# Patient Record
Sex: Male | Born: 1965 | Race: White | Hispanic: No | Marital: Married | Smoking: Former smoker
Health system: Southern US, Community
[De-identification: ages and names within clinical notes are randomized; demographics above are authoritative.]

## PROBLEM LIST (undated history)

## (undated) DIAGNOSIS — K219 Gastro-esophageal reflux disease without esophagitis: Secondary | ICD-10-CM

## (undated) DIAGNOSIS — F419 Anxiety disorder, unspecified: Secondary | ICD-10-CM

## (undated) DIAGNOSIS — J189 Pneumonia, unspecified organism: Secondary | ICD-10-CM

## (undated) DIAGNOSIS — Z8619 Personal history of other infectious and parasitic diseases: Secondary | ICD-10-CM

## (undated) DIAGNOSIS — E785 Hyperlipidemia, unspecified: Secondary | ICD-10-CM

## (undated) DIAGNOSIS — Z87442 Personal history of urinary calculi: Secondary | ICD-10-CM

## (undated) HISTORY — DX: Gastro-esophageal reflux disease without esophagitis: K21.9

## (undated) HISTORY — DX: Personal history of other infectious and parasitic diseases: Z86.19

## (undated) HISTORY — PX: CHOLECYSTECTOMY: SHX55

## (undated) HISTORY — DX: Personal history of urinary calculi: Z87.442

## (undated) HISTORY — DX: Anxiety disorder, unspecified: F41.9

## (undated) HISTORY — DX: Hyperlipidemia, unspecified: E78.5

---

## 1999-05-09 HISTORY — PX: FOOT SURGERY: SHX648

## 2009-11-24 ENCOUNTER — Ambulatory Visit: Payer: Self-pay | Admitting: Family

## 2009-11-24 DIAGNOSIS — F341 Dysthymic disorder: Secondary | ICD-10-CM | POA: Insufficient documentation

## 2009-11-24 DIAGNOSIS — F17200 Nicotine dependence, unspecified, uncomplicated: Secondary | ICD-10-CM | POA: Insufficient documentation

## 2009-12-22 ENCOUNTER — Ambulatory Visit: Payer: Self-pay | Admitting: Family

## 2010-01-18 ENCOUNTER — Ambulatory Visit: Payer: Self-pay | Admitting: Family

## 2010-01-24 ENCOUNTER — Telehealth: Payer: Self-pay | Admitting: Family

## 2010-01-28 ENCOUNTER — Ambulatory Visit: Payer: Self-pay | Admitting: Family

## 2010-02-15 ENCOUNTER — Telehealth (INDEPENDENT_AMBULATORY_CARE_PROVIDER_SITE_OTHER): Payer: Self-pay | Admitting: *Deleted

## 2010-05-10 ENCOUNTER — Ambulatory Visit
Admission: RE | Admit: 2010-05-10 | Discharge: 2010-05-10 | Payer: Self-pay | Source: Home / Self Care | Attending: Family | Admitting: Family

## 2010-05-10 ENCOUNTER — Encounter: Payer: Self-pay | Admitting: Family

## 2010-05-10 LAB — CONVERTED CEMR LAB
Alkaline Phosphatase: 141 units/L — ABNORMAL HIGH (ref 39–117)
Basophils Absolute: 0 10*3/uL (ref 0.0–0.1)
Basophils Relative: 0 % (ref 0–1)
Bilirubin, Direct: 0.1 mg/dL (ref 0.0–0.3)
Indirect Bilirubin: 0.2 mg/dL (ref 0.0–0.9)
Lipase: 19 units/L (ref 0–75)
Lymphocytes Relative: 31 % (ref 12–46)
MCHC: 32.9 g/dL (ref 30.0–36.0)
Neutro Abs: 5.6 10*3/uL (ref 1.7–7.7)
Neutrophils Relative %: 61 % (ref 43–77)
RBC: 5.13 M/uL (ref 4.22–5.81)
RDW: 13.2 % (ref 11.5–15.5)

## 2010-05-11 ENCOUNTER — Telehealth: Payer: Self-pay | Admitting: Family

## 2010-05-11 ENCOUNTER — Ambulatory Visit (HOSPITAL_BASED_OUTPATIENT_CLINIC_OR_DEPARTMENT_OTHER)
Admission: RE | Admit: 2010-05-11 | Discharge: 2010-05-11 | Payer: Self-pay | Source: Home / Self Care | Attending: Internal Medicine | Admitting: Internal Medicine

## 2010-05-23 ENCOUNTER — Encounter: Payer: Self-pay | Admitting: Family

## 2010-05-25 ENCOUNTER — Ambulatory Visit (HOSPITAL_COMMUNITY)
Admission: RE | Admit: 2010-05-25 | Discharge: 2010-05-25 | Payer: Self-pay | Source: Home / Self Care | Attending: Surgery | Admitting: Surgery

## 2010-05-27 NOTE — Op Note (Signed)
Jonathan Powell, Jonathan Powell                 ACCOUNT NO.:  [id number]  MEDICAL RECORD NO.:  [id number]          PATIENT TYPE:  AMB  LOCATION:  SDS                          FACILITY:  [facility]  PHYSICIAN:  Abigail Miyamoto, M.D. DATE OF BIRTH:  11/13/1965  DATE OF PROCEDURE:  05/25/2010 DATE OF DISCHARGE:  05/25/2010                              OPERATIVE REPORT   PREOPERATIVE DIAGNOSIS:  Symptomatic cholelithiasis.  POSTOPERATIVE DIAGNOSIS:  Symptomatic cholelithiasis.  PROCEDURE:  Laparoscopic cholecystectomy.  SURGEON:  Abigail Miyamoto, MD  ANESTHESIA:  General and Marcaine.  ESTIMATED BLOOD LOSS:  Minimal.  FINDINGS:  The patient had chronically scarred appearing gallbladder with gallstones consistent with chronic cholecystitis.  PROCEDURE IN DETAIL:  The patient was brought to the operating room and identified as Jonathan Powell.  He was placed supine on the operating room table.  General anesthesia was induced.  His abdomen was then prepped and draped in the usual sterile fashion.  Using a #15 blade, a small vertical incision was made below the umbilicus.  This was carried down to the fascia, which was then opened with a scalpel.  A hemostat was used to pass through the peritoneal cavity.  Next, 0 Vicryl suture pursestring suture was placed around the fascial opening.  The Hasson port was placed through the opening and insufflation of the abdomen was begun.  A 5-mm port was then placed  A 5-mm port was then placed in the patient's epigastrium and two more 5-mm ports were placed in the patient's right upper quadrant, all under direct vision.  The gallbladder was grasped, retracted above the liver bed, was found to be thick walled in appearance.  The cystic duct was easily dissected out. I then clipped it once distally and opened up with laparoscopic scissors.  It was a very tiny duct.  I placed a cholangiocatheter through a small incision in the right upper quadrant after I  had multiple times to get into the tiny opening of cystic duct.  This was not possible.  Therefore, I aborted cholangiogram.  I clipped the cystic duct 3 times proximally and completely transected it.  The cystic artery and posterior branches were identified, clipped proximally and distally, and transected as well.  The gallbladder was slowly dissected free from liver bed with electrocautery.  Once it was free from liver bed, it was removed through the incision at the umbilicus.  I again examined the liver bed and hemostasis was felt to be achieved with cautery.  The 0 Vicryl at the umbilicus was tied in place closing the fascial defect. All ports were then removed under direct vision after irrigating the abdomen.  All incisions were then anesthetized with Marcaine and closed with 4-0 Monocryl subcuticular sutures.  Steri-Strips and Band-Aids were applied.  The patient tolerated the procedure well.  All sponge, needle, and instrument counts were correct at the end of the procedure.  The patient was then extubated in the operating room and taken in stable condition to the recovery room.     Abigail Miyamoto, M.D.     DB/MEDQ  D:  05/25/2010  T:  05/26/2010  Job:  [id number]  Electronically Signed by Abigail Miyamoto M.D. on 05/27/2010 11:03:45 AM

## 2010-05-30 LAB — COMPREHENSIVE METABOLIC PANEL
ALT: 56 U/L — ABNORMAL HIGH (ref 0–53)
Alkaline Phosphatase: 102 U/L (ref 39–117)
BUN: 13 mg/dL (ref 6–23)
CO2: 29 mEq/L (ref 19–32)
Calcium: 9.6 mg/dL (ref 8.4–10.5)
GFR calc non Af Amer: >60 mL/min (ref >60)
Glucose, Bld: 103 mg/dL — ABNORMAL HIGH (ref 70–99)
Sodium: 142 mEq/L (ref 135–145)
Total Protein: 6.1 g/dL (ref 6.0–8.3)

## 2010-05-30 LAB — SURGICAL PCR SCREEN
MRSA, PCR: NEGATIVE
Staphylococcus aureus: NEGATIVE

## 2010-05-30 LAB — CBC
HCT: 43.9 % (ref 39.0–52.0)
Hemoglobin: 15 g/dL (ref 13.0–17.0)
MCH: 31.4 pg (ref 26.0–34.0)
MCHC: 34.2 g/dL (ref 30.0–36.0)
MCV: 91.8 fL (ref 78.0–100.0)
RDW: 12.5 % (ref 11.5–15.5)

## 2010-06-07 NOTE — Assessment & Plan Note (Signed)
Summary: 1 month follow up/mhf   Vital Signs:  Patient profile:   45 year old male Height:      70.5 inches (179.07 cm) Weight:      155.25 pounds (70.57 kg) BMI:     22.04 Temp:     97.9 degrees F (36.61 degrees C) oral Pulse rate:   72 / minute Pulse rhythm:   regular BP sitting:   102 / 74  (right arm) Cuff size:   regular  Vitals Entered By: Brenton Grills MA (December 22, 2009 8:35 AM) CC: 1 month F/U/aj   CC:  1 month F/U/aj.  History of Present Illness: Mr. Jonathan Powell is a 45 year old male who presents today for follow up of his anxiety/depression.  He and his wife separated 9 weeks ago.  He remains very upset about his separation.  Still not sleeping well.  Notes some mild improvement in his depression since he started the Zoloft.  Denies any noticeable side effects.  Continues to have panic attacks, but not every day.  Klonopin does help him during his panic attacks.  He continues to meet weekly with his therapist.  Pt reports that he is eating well, 3 meals a day.  Snacks regularly.    Current Medications (verified): 1)  Daily Multi  Tabs (Multiple Vitamins-Minerals) .... Take 1 Tablet By Mouth Once A Day 2)  Omega-3 350 Mg Caps (Omega-3 Fatty Acids) .... Take 1 Capsule By Mouth Once A Day 3)  Zoloft 50 Mg Tabs (Sertraline Hcl) .... 1/2 Tab By Mouth Daily X 1 Week, Then Increase To One Tablet By Mouth Daily On The Second Week. 4)  Klonopin 0.5 Mg Tabs (Clonazepam) .... One Tab By Mouth Three Time Daily As Needed  Allergies (verified): No Known Drug Allergies  Physical Exam  General:  Well-developed,well-nourished,in no acute distress; alert,appropriate and cooperative throughout examination Psych:  normally interactive, good eye contact, and subdued.     Impression & Recommendations:  Problem # 1:  ANXIETY DEPRESSION (ICD-300.4) Assessment Unchanged Very little improvement in his symptoms, will increase zoloft from 50mg  to 100mg . Continue PRN klonopin and weekly  counseling sessions.  If no improvement with this regimen will consider switching to cymbalta.  20 minutes were spent with patient.  Greater than 50% of this time was spent counseling patient on his depression/anxiety.    Problem # 2:  WEIGHT LOSS, RECENT (ICD-783.21) Assessment: New Pt continues to lose weight.  Reports that he is eating well- "but only when I am hungry"  recommended that he eat scheduled meals and add a protein shake twice daily in addition to his meals to help prevent further weight loss.    Complete Medication List: 1)  Daily Multi Tabs (Multiple vitamins-minerals) .... Take 1 tablet by mouth once a day 2)  Omega-3 350 Mg Caps (Omega-3 fatty acids) .... Take 1 capsule by mouth once a day 3)  Zoloft 100 Mg Tabs (Sertraline hcl) .... One tablet by mouth daily 4)  Klonopin 0.5 Mg Tabs (Clonazepam) .... One tab by mouth three time daily as needed  Patient Instructions: 1)  Please follow up with your therapist as you are. 2)  Increase your Zoloft to 100mg  by mouth daily. 3)  Follow up in 1 month, sooner if symptoms worsen or do not improve.  Prescriptions: ZOLOFT 100 MG TABS (SERTRALINE HCL) one tablet by mouth daily  #30 x 1   Entered and Authorized by:   Lemont Fillers FNP   Signed by:  Lemont Fillers FNP on 12/22/2009   Method used:   Electronically to        [facility] [phone]* (retail)       [address] [place], [place]  [address]       Ph: [phone] or [phone]       Fax: [phone]   RxID:   [id number]

## 2010-06-07 NOTE — Progress Notes (Signed)
Summary: Records request  Records request received from [facility]. Forwarded to [employer] for processing. Rene Kocher Flowers  February 15, 2010 11:41 AM

## 2010-06-07 NOTE — Assessment & Plan Note (Signed)
Summary: TO EST   /ANXIETY/HEA--Rm 4   Vital Signs:  Patient profile:   45 year old male Height:      70.5 inches Weight:      159.50 pounds BMI:     22.64 Temp:     98.3 degrees F oral Pulse rate:   84 / minute Pulse rhythm:   regular Resp:     20 per minute BP sitting:   110 / 78  (right arm) Cuff size:   regular  Vitals Entered By: Mervin Kung CMA Duncan Dull) (November 24, 2009 8:37 AM) CC: Room 4  New pt.  Not sleeping well, having anxiety and chest pains. Is Patient Diabetic? No   CC:  Room 4  New pt.  Not sleeping well and having anxiety and chest pains..  History of Present Illness: [name] is a 45 year old male who presents today to establish care.  His chief complaint today is anxiety.  Pt reports that he and his wife separated 6 weeks ago and he has found this to be devastating.  Patient has lost 40 ounds in the last 4 months.  Fist 15 lbs were intensional on Atkins.  He reports that the additional weight loss has been since his separation as he has not been eating well.  He started seeing a therapist 7 weeks ago.   Has 7 and 43 year old boys.  Notes + anxiety- notes difficulty falling asleep and difficulty staying asleep.  Sleeps 4-5 hours a night. Notes + panic attacks- severe anxiety attack 3 weeks ago. He does note a sense of generalized chest tightness which acccompanies these panic attacks.  He exercises regularly and denies any chest tightness during exercise.   Reports decreased interest in things he used to like to do.  Denies active suicide ideation.  Preventive Screening-Counseling & Management  Alcohol-Tobacco     Alcohol drinks/day: <1     Smoking Status: current     Packs/Day: 2.0     Pack years: 67yrs  Caffeine-Diet-Exercise     Caffeine use/day: 3-4 daily     Does Patient Exercise: yes     Exercise (avg: min/session): 30-60     Times/week: 3  Allergies (verified): No Known Drug Allergies  Past History:  Past Medical History: Chicken Pox HIV  testing Kidney Stones  Past Surgical History: Right Foot surgery--2001  Family History: Mother--Hypercholesterolemia, living Father-- A&W Maternal GM-- arthritis Maternal GF--A & W Paternal GM--Deceased, dementia Paternal GF-- Deceased, kidney failure, alcoholism two sons- alive and well, one child ADD   Social History: Occupation: vice Engineer, structural, security company Current Smoker-  2 PPD. Alcohol use-yes,  very rare 1-2 drinks Regular exercise-yes Smoking Status:  current Packs/Day:  2.0 Caffeine use/day:  3-4 daily Does Patient Exercise:  yes  Review of Systems       Constitutional: Denies Fever ENT:  Denies nasal congestion or sore throat. Resp: Denies cough CV:  Notes chest tightness associated with anxiety, no chest pain with exercise GI:  Denies nausea or vomitting GU: Denies dysuria Lymphatic: Denies lymphadenopathy Musculoskeletal:  Denies muscle/joint pain Skin:  Denies Rashes Psychiatric: see HPI Neuro: Denies numbness     Physical Exam  General:  Well-developed,well-nourished,in no acute distress; alert,appropriate and cooperative throughout examination Head:  Normocephalic and atraumatic without obvious abnormalities. No apparent alopecia or balding. Lungs:  Normal respiratory effort, chest expands symmetrically. Lungs are clear to auscultation, no crackles or wheezes. Heart:  Normal rate and regular rhythm. S1 and S2 normal  without gallop, murmur, click, rub or other extra sounds. Psych:  became tearful during discussion about his separation. Oriented X3, normally interactive, and good eye contact.     Impression & Recommendations:  Problem # 1:  ANXIETY DEPRESSION (ICD-300.4) Has been seeing a therapist- I encouraged him to continue.  Will give trial of Zoloft and also add as needed klonopin for severe anxiety.  Suspect that he will not need medications long term as his symptoms are largely situational. Encouraged healthy eating.  Pt was  instructed as noted on pt. sign out sheet.  A normal EKG was performed today in the office and notes NSR rate 65. Patient's chest tightness is only associated with panic attacks.   Will monitor.  Plan f/u in 1 month.  30 minutes were spent with patient.  Greater than 50% of this time was spent counseling patient on his anxiety and depression.  Problem # 2:  TOBACCO ABUSE (ICD-305.1) Patient was counseled on importance of smoking cessation. Orders: Tobacco use cessation intermediate 3-10 minutes (99406)  Complete Medication List: 1)  Daily Multi Tabs (Multiple vitamins-minerals) .... Take 1 tablet by mouth once a day 2)  Omega-3 350 Mg Caps (Omega-3 fatty acids) .... Take 1 capsule by mouth once a day 3)  Zoloft 50 Mg Tabs (Sertraline hcl) .... 1/2 tab by mouth daily x 1 week, then increase to one tablet by mouth daily on the second week. 4)  Klonopin 0.5 Mg Tabs (Clonazepam) .... One tab by mouth three time daily as needed  Patient Instructions: 1)  Sertraline (Zoloft) Please start 1/2 tablet one a day for one week, then increase to a full tablet. 2)  It will likely take several weeks before you will notice improvement. 3)  Side effects of this medicine may include drowsiness or nausea.  If this becomes an issue for you call for further instructions. 4)  Very rarely people may develop suicidal thoughts when taking these types of medicines- should this happen to you, discontinue medication and go directly to the emergency room. 5)  Please arrange a follow up appointment in 1 month  Prescriptions: KLONOPIN 0.5 MG TABS (CLONAZEPAM) one tab by mouth three time daily as needed  #30 x 0   Entered and Authorized by:   Lemont Fillers FNP   Signed by:   Lemont Fillers FNP on 11/24/2009   Method used:   Print then Give to Patient   RxID:   [id number] ZOLOFT 50 MG TABS (SERTRALINE HCL) 1/2 tab by mouth daily x 1 week, then increase to one tablet by mouth daily on the second week.  #30  x 2   Entered and Authorized by:   Lemont Fillers FNP   Signed by:   Lemont Fillers FNP on 11/24/2009   Method used:   Electronically to        [facility] [phone][facility] (retail)       [address]       [place], [place]  [address]       Ph: [phone] or [phone]       Fax: [phone]   RxID:   [id number]     Current Allergies (reviewed today): No known allergies

## 2010-06-07 NOTE — Assessment & Plan Note (Signed)
Summary: follow up/hea   Vital Signs:  Patient profile:   45 year old male Weight:      163.50 pounds BMI:     23.21 O2 Sat:      97 % on Room air Temp:     98.1 degrees F oral Pulse rate:   64 / minute Pulse rhythm:   regular Resp:     16 per minute BP sitting:   118 / 66  (right arm) Cuff size:   regular  Vitals Entered By: Glendell Docker CMA (January 28, 2010 8:11 AM)  O2 Flow:  Room air CC: follow-up visit Is Patient Diabetic? No Pain Assessment Patient in pain? no      Comments no concerns, medicatioin working well, no problems   CC:  follow-up visit.  History of Present Illness: [name] is a 45 year old male who presents today to follow up on his anxiety and depression.  Last visit he was changed from Zoloft 50mg  to Zoloft 100mg .  He reports that overall his symptoms have improved.  He notes that he continues to have stress related to his recent separation.  Falls asleep ok, but finds that he often will wake up during the night and have trouble falling back to sleep.  He is hoping not to be on Zoloft long term.  Denies any associated side effects.  Overall feels more "mellow."  Preventive Screening-Counseling & Management  Alcohol-Tobacco     Smoking Status: current  Allergies (verified): No Known Drug Allergies  Past History:  Past Medical History: Last updated: 11/24/2009 Chicken Pox HIV testing Kidney Stones  Past Surgical History: Last updated: 11/24/2009 Right Foot surgery--2001  Physical Exam  General:  Well-developed,well-nourished,in no acute distress; alert,appropriate and cooperative throughout examination Head:  Normocephalic and atraumatic without obvious abnormalities. No apparent alopecia or balding. Psych:  Cognition and judgment appear intact. Alert and cooperative with normal attention span and concentration. No apparent delusions, illusions, hallucinations   Impression & Recommendations:  Problem # 1:  ANXIETY DEPRESSION  (ICD-300.4) Assessment Improved Symptoms are improving.  Will plan to continue medications and have patient follow up after the holidays.  We can consider a taper plan off of Zoloft at that time. 15 minutes were spent with patient.  Greater than 50% of this time was spent counseling patient on his anxiety and depression.  Complete Medication List: 1)  Daily Multi Tabs (Multiple vitamins-minerals) .... Take 1 tablet by mouth once a day 2)  Omega-3 350 Mg Caps (Omega-3 fatty acids) .... Take 1 capsule by mouth once a day 3)  Zoloft 100 Mg Tabs (Sertraline hcl) .... One tablet by mouth daily 4)  Klonopin 0.5 Mg Tabs (Clonazepam) .... One tab by mouth three time daily as needed  Patient Instructions: 1)  Please schedule a follow-up appointment in 3 months. 2)  Follow up sooner if problems or concerns. 3)  Have a nice Fall! Prescriptions: KLONOPIN 0.5 MG TABS (CLONAZEPAM) one tab by mouth three time daily as needed  #30 x 0   Entered and Authorized by:   Lemont Fillers FNP   Signed by:   Lemont Fillers FNP on 01/28/2010   Method used:   Print then Give to Patient   RxID:   [id number] ZOLOFT 100 MG TABS (SERTRALINE HCL) one tablet by mouth daily  #30 x 3   Entered and Authorized by:   Lemont Fillers FNP   Signed by:   Lemont Fillers FNP on 01/28/2010  Method used:   Electronically to        [facility] [phone][facility] (retail)       [address]       [place], [place]  [address]       Ph: [phone] or [phone]       Fax: [phone]   RxID:   [id number]   Current Allergies (reviewed today): No known allergies    Immunization History:  Influenza Immunization History:    Influenza:  declined (01/28/2010)   Contraindications/Deferment of Procedures/Staging:    Test/Procedure: FLU VAX    Reason for deferment: patient declined

## 2010-06-07 NOTE — Progress Notes (Signed)
Summary: no show letter mailed   Phone Note Outgoing Call   Call placed by: Marj Call placed to: Patient Summary of Call: no show letter mailed  Initial call taken by: Roselle Locus,  January 24, 2010 9:14 AM

## 2010-06-09 NOTE — Progress Notes (Signed)
  Phone Note Outgoing Call   Call placed by: Lemont Fillers FNP,  May 11, 2010 4:58 PM Call placed to: Patient Details for Reason: ultrasound results Summary of Call: Called patient, reviewed ultrasound results.  +cholelithiasis.  Wil refer to surgeon for evaluation for possible chronic cholecystitis. Initial call taken by: Lemont Fillers FNP,  May 11, 2010 4:59 PM  New Problems: CHOLELITHIASIS, SYMPTOMATIC (ICD-574.20)   New Problems: CHOLELITHIASIS, SYMPTOMATIC (ICD-574.20)

## 2010-06-09 NOTE — Assessment & Plan Note (Signed)
Summary: COLD/MHF--Rm 5   Vital Signs:  Patient profile:   45 year old male Height:      70.5 inches Weight:      170 pounds BMI:     24.13 Temp:     98.1 degrees F oral Pulse rate:   84 / minute Pulse rhythm:   regular Resp:     16 per minute BP sitting:   130 / 80  (right arm) Cuff size:   regular  Vitals Entered By: Mervin Kung CMA Duncan Dull) (May 10, 2010 2:27 PM) CC: Pt states he has had stomach pain after eating x 4 months. Has frequent indigestion. Also has head and chest congestion with productive cough. Is Patient Diabetic? No Pain Assessment Patient in pain? no        Primary Care Provider:  Lemont Fillers FNP  CC:  Pt states he has had stomach pain after eating x 4 months. Has frequent indigestion. Also has head and chest congestion with productive cough..  History of Present Illness: Stomach pain-  notes that his stomach hurts when he eats- hurts "for a while" after eating.  Has been present for about 2 months.  More on the right side.   + indigestion/heartburn.  Mild improvement with maalox, tums.  Has tried generic prilosec prior to meals with minimal improvement.  Denies vomitting.  BM's perhaps slighly less frequent.  Denies black or bloody stools.  Occasionally nausea.  Dull constant pain. Pain will last several hour after a meal.  Worst at the end of the day.  Has made dietary changes with little improvement.  URI-  started Thursday AM with congestion.  Felt feverish for a few days.  Tried dayquil/nyquil without improvement.  Robitussin did not help.  Mucinex helped.  Clear nasal discharge.    Allergies (verified): No Known Drug Allergies  Past History:  Past Medical History: Last updated: 11/24/2009 Chicken Pox HIV testing Kidney Stones  Past Surgical History: Last updated: 11/24/2009 Right Foot surgery--2001  Review of Systems       see HPI  Physical Exam  General:  Well-developed,well-nourished,in no acute distress;  alert,appropriate and cooperative throughout examination Head:  Normocephalic and atraumatic without obvious abnormalities. No apparent alopecia or balding. Ears:  R TM + erythema, mild bulging Mouth:  Oral mucosa and oropharynx without lesions or exudates.  Teeth in good repair. Neck:  No deformities, masses, or tenderness noted. Lungs:  Normal respiratory effort, chest expands symmetrically. Lungs are clear to auscultation, no crackles or wheezes. Heart:  Normal rate and regular rhythm. S1 and S2 normal without gallop, murmur, click, rub or other extra sounds. Abdomen:  + epigastric tenderness to palpation.  + BS, abdomen is soft, non-distended. Psych:  Cognition and judgment appear intact. Alert and cooperative with normal attention span and concentration. No apparent delusions, illusions, hallucinations   Impression & Recommendations:  Problem # 1:  EPIGASTRIC PAIN (ICD-789.06) Assessment New Suspect ulcer- pt notes frequent use of NSAIDS.  Recommended discontinuation of NSAIDS.  Check labs as below.  Consider further abdominal imaging if elevated transaminases/lipase/amylase.  F/u in 1 month.  If no improvement will plan referral to GI.  Orders: TLB-Hepatic/Liver Function Pnl (80076-HEPATIC) TLB-CBC Platelet - w/Differential (85025-CBCD) T-Amylase (98119-14782) T-Lipase (95621-30865)  Problem # 2:  OTITIS MEDIA (ICD-382.9) Assessment: New Right ear.  Will treat with amoxicillin.  His updated medication list for this problem includes:    Amoxicillin 500 Mg Caps (Amoxicillin) .Marland Kitchen... 2 caps by mouth three times a day  for 10 days  Problem # 3:  ANXIETY DEPRESSION (ICD-300.4) Assessment: Improved Pt wants to titrate off of zoloft.  Recommended that he cut down to 50mg  by mouth daily until his f/u in 1 month.  Complete Medication List: 1)  Daily Multi Tabs (Multiple vitamins-minerals) .... Take 1 tablet by mouth once a day 2)  Zoloft 100 Mg Tabs (Sertraline hcl) .... One half tablet  by mouth once daily 3)  Mucinex Maximum Strength 1200 Mg Xr12h-tab (Guaifenesin) .... Take 1 tablet by mouth once a day. 4)  Zegerid 40-1100 Mg Caps (Omeprazole-sodium bicarbonate) .... One cap by mouth daily 5)  Amoxicillin 500 Mg Caps (Amoxicillin) .... 2 caps by mouth three times a day for 10 days  Patient Instructions: 1)  Please complete your lab work downstairs. 2)  Call if you develop vomitting, black or bloody stools. 3)  Stop Aleve and motrin. 4)  Follow up in 1 month.   Prescriptions: AMOXICILLIN 500 MG CAPS (AMOXICILLIN) 2 caps by mouth three times a day for 10 days  #60 x 0   Entered and Authorized by:   Lemont Fillers FNP   Signed by:   Lemont Fillers FNP on 05/10/2010   Method used:   Electron[facility] to        [facility] [phone]* (retail)       [address]       [place], [place]  [address]       Ph: [phone] or [phone]       Fax: [phone]   RxID:   [id number] ZEGERID 40-1100 MG CAPS (OMEPRAZOLE-SODIUM BICARBONATE) one cap by mouth daily  #30 x 1   Entered and Authorized by:   Lemont Fillers FNP   Signed by:   Lemont Fillers FNP on 05/10/2010   Method used:   Electronically to        [facility] [phone]* (retail)       [address]       [place], [place]  [address]       Ph: [phone] or [phone]       Fax: [phone]   RxID:   [id number]    Orders Added: 1)  TLB-Hepatic/Liver Function Pnl [80076-HEPATIC] 2)  TLB-CBC Platelet - w/Differential [85025-CBCD] 3)  T-Amylase [82150-23210] 4)  T-Lipase [83690-23215] 5)  Est. Patient Level III [37628]    Current Allergies (reviewed today): No known allergies

## 2010-06-09 NOTE — Progress Notes (Signed)
  Phone Note Outgoing Call   Call placed by: Lemont Fillers FNP,  May 11, 2010 8:38 AM Call placed to: Patient Summary of Call: Called patient, reviewed results of lab work and need to complete abdominal ultrasound.  Pt is agreeable. Initial call taken by: Lemont Fillers FNP,  May 11, 2010 8:39 AM

## 2010-06-13 ENCOUNTER — Encounter: Payer: Self-pay | Admitting: Family

## 2010-06-13 ENCOUNTER — Ambulatory Visit (INDEPENDENT_AMBULATORY_CARE_PROVIDER_SITE_OTHER): Payer: BC Managed Care – PPO | Admitting: Family

## 2010-06-13 DIAGNOSIS — F341 Dysthymic disorder: Secondary | ICD-10-CM

## 2010-06-13 DIAGNOSIS — K8020 Calculus of gallbladder without cholecystitis without obstruction: Secondary | ICD-10-CM

## 2010-06-13 DIAGNOSIS — F17200 Nicotine dependence, unspecified, uncomplicated: Secondary | ICD-10-CM

## 2010-06-23 NOTE — Assessment & Plan Note (Signed)
Summary: 1 month fu/mhf--rm 4   Vital Signs:  Patient profile:   45 year old male Height:      70.5 inches Weight:      172.25 pounds BMI:     24.45 O2 Sat:      100 % on Room air Temp:     98.7 degrees F oral Pulse rate:   88 / minute Pulse rhythm:   regular Resp:     18 per minute BP sitting:   110 / 80  (right arm) Cuff size:   regular  Vitals Entered By: Mervin Kung CMA Duncan Dull) (June 13, 2010 9:45 AM)  O2 Flow:  Room air CC: Pt here for 1 month follow up. Had cholecystectomy 2 1/2 weeks ago. Is Patient Diabetic? No Pain Assessment Patient in pain? no      Comments Pt no longer takes MVI and Zegerid. Pt completed amoxicillin. Nicki Guadalajara Fergerson CMA Duncan Dull)  June 13, 2010 9:50 AM    Primary Care Provider:  Lemont Fillers FNP  CC:  Pt here for 1 month follow up. Had cholecystectomy 2 1/2 weeks ago.Marland Kitchen  History of Present Illness: 45 year old male who presents today following his cholecystectomy.  1)Cholecystitis-  Reports that he had his Gallbladder removed about 3 weeks ago. Has been feeling well, no longer needing PPI.    Tolerating by mouth's without difficulty.  2) Depression/Anxiety-  has been cutting his zoloft in half.  Some times he forgets to take the dose.  Notices that he is more irritable when he stops taking the medication.  Otherwise he is feeling well.    3) Tobacco abuse-  Patient reports that he continues to smoke.  Not currently motivated to quit.   Allergies (verified): No Known Drug Allergies  Past History:  Past Surgical History: Right Foot surgery--2001 cholecystectomy-- 05/2010 Dr Magnus Ivan  Review of Systems       see HPI  Physical Exam  General:  Well-developed,well-nourished,in no acute distress; alert,appropriate and cooperative throughout examination Lungs:  Normal respiratory effort, chest expands symmetrically. Lungs are clear to auscultation, no crackles or wheezes. Heart:  Normal rate and regular rhythm. S1 and S2  normal without gallop, murmur, click, rub or other extra sounds. Abdomen:  soft, non-tender, non-distended.  Incisions clean, dry and intact. Healing well.  + BS's Psych:  Cognition and judgment appear intact. Alert and cooperative with normal attention span and concentration. No apparent delusions, illusions, hallucinations   Impression & Recommendations:  Problem # 1:  ANXIETY DEPRESSION (ICD-300.4) Assessment Unchanged This appears stable.  He does not at this time appear to be toleratin discontinuation.  Will continue the zoloft at decreased dosing- 50 mg.  Plan to have pt f/u in 3 months.  Can consider titration down to 25mg  at that time if he wishes.  He agrees that he wishes to stay on med at this time. 15 minutes spent with patient .  Greater than 50% of this time was spent counseling patient on his anxiety and depression  Problem # 2:  CHOLELITHIASIS, SYMPTOMATIC (ICD-574.20) Assessment: Improved s/p cholecystectomy.  Patient appears to be doing well post-operatively without complication.  Problem # 3:  TOBACCO ABUSE (ICD-305.1) Assessment: Unchanged  Patient was counseled on importance of smoking cessation x 3-5 minutes.  Orders: Tobacco use cessation intermediate 3-10 minutes (99406)  Complete Medication List: 1)  Zoloft 100 Mg Tabs (Sertraline hcl) .... Take as directed  Patient Instructions: 1)  Please schedule a follow-up appointment in 3  months. Prescriptions: ZOLOFT 100 MG TABS (SERTRALINE HCL) take as directed  #30 x 2   Entered and Authorized by:   Lemont Fillers FNP   Signed by:   Lemont Fillers FNP on 06/13/2010   Method used:   Electronically to        [facility] [phone]* (retail)       [address]       [place], [place]  [address]       Ph: [phone] or [phone]       Fax: [phone]   RxID:   [id number]    Orders Added: 1)  Est. Patient Level III [34742] 2)  Tobacco use cessation intermediate 3-10 minutes  [99406]    Current Allergies (reviewed today): No known allergies

## 2010-06-23 NOTE — Consult Note (Signed)
Summary: [facility]   Imported By: Lanelle Bal 06/17/2010 11:22:40  _____________________________________________________________________  External Attachment:    Type:   Image     Comment:   External Document

## 2010-09-08 ENCOUNTER — Encounter: Payer: Self-pay | Admitting: Internal Medicine

## 2010-09-12 ENCOUNTER — Ambulatory Visit: Payer: BC Managed Care – PPO | Admitting: Internal Medicine

## 2010-09-12 DIAGNOSIS — Z0289 Encounter for other administrative examinations: Secondary | ICD-10-CM

## 2011-04-26 ENCOUNTER — Encounter: Payer: Self-pay | Admitting: Family

## 2011-04-26 ENCOUNTER — Telehealth: Payer: Self-pay | Admitting: Family

## 2011-04-26 ENCOUNTER — Ambulatory Visit (INDEPENDENT_AMBULATORY_CARE_PROVIDER_SITE_OTHER): Payer: BC Managed Care – PPO | Admitting: Family

## 2011-04-26 ENCOUNTER — Ambulatory Visit (HOSPITAL_BASED_OUTPATIENT_CLINIC_OR_DEPARTMENT_OTHER)
Admission: RE | Admit: 2011-04-26 | Discharge: 2011-04-26 | Disposition: A | Discharge status: Home / Self Care | Payer: BC Managed Care – PPO | Source: Ambulatory Visit | Attending: Family | Admitting: Family

## 2011-04-26 DIAGNOSIS — J209 Acute bronchitis, unspecified: Secondary | ICD-10-CM

## 2011-04-26 DIAGNOSIS — R739 Hyperglycemia, unspecified: Secondary | ICD-10-CM

## 2011-04-26 DIAGNOSIS — R7309 Other abnormal glucose: Secondary | ICD-10-CM

## 2011-04-26 DIAGNOSIS — R059 Cough, unspecified: Secondary | ICD-10-CM

## 2011-04-26 DIAGNOSIS — R05 Cough: Secondary | ICD-10-CM

## 2011-04-26 DIAGNOSIS — R062 Wheezing: Secondary | ICD-10-CM | POA: Insufficient documentation

## 2011-04-26 DIAGNOSIS — F17200 Nicotine dependence, unspecified, uncomplicated: Secondary | ICD-10-CM

## 2011-04-26 DIAGNOSIS — R7989 Other specified abnormal findings of blood chemistry: Secondary | ICD-10-CM | POA: Insufficient documentation

## 2011-04-26 DIAGNOSIS — L989 Disorder of the skin and subcutaneous tissue, unspecified: Secondary | ICD-10-CM

## 2011-04-26 LAB — HEPATITIS B SURFACE ANTIGEN: Hepatitis B Surface Ag: NEGATIVE

## 2011-04-26 LAB — HEMOGLOBIN A1C
Hgb A1c MFr Bld: 6.1 % — ABNORMAL HIGH (ref <5.7)
Mean Plasma Glucose: 128 mg/dL — ABNORMAL HIGH (ref <117)

## 2011-04-26 LAB — HEPATIC FUNCTION PANEL
Alkaline Phosphatase: 139 U/L — ABNORMAL HIGH (ref 39–117)
Bilirubin, Direct: 0.1 mg/dL (ref 0.0–0.3)
Indirect Bilirubin: 0.3 mg/dL (ref 0.0–0.9)
Total Bilirubin: 0.4 mg/dL (ref 0.3–1.2)

## 2011-04-26 LAB — FERRITIN: Ferritin: 289 ng/mL (ref 22–322)

## 2011-04-26 MED ORDER — PREDNISONE 10 MG PO TABS: ORAL_TABLET | ORAL | Status: DC

## 2011-04-26 MED ORDER — AZITHROMYCIN 250 MG PO TABS: ORAL_TABLET | ORAL | Status: AC

## 2011-04-26 MED ORDER — ALBUTEROL SULFATE HFA 108 (90 BASE) MCG/ACT IN AERS: 2.0000 | INHALATION_SPRAY | Freq: Four times a day (QID) | RESPIRATORY_TRACT | Status: DC | PRN

## 2011-04-26 NOTE — Progress Notes (Signed)
  Subjective:    Patient ID: Jasten Guyette, male    DOB: [birth year], 46 y.o.   MRN: [id number]  HPI  Mr.  Fier is a 45 yr old male who presents with chief complaint of cough.  He reports that symptoms started  With fever of 103 two weeks ago.  Temp was elevated x 48 hours.  Fever has resolved but he has persistent head congestion and chest congestion. Taking mucinex-d without much improvement.  Energy is poor.  Breathing feels tight. He continues to smoke.  Pt was told that his LFT's were elevated at a recent life insurance screening.  He would like [place] to evaluate this.   Review of Systems See HPI  Past Medical History  Diagnosis Date  . History of chicken pox   . History of kidney stones     History   Social History  . Marital Status: Legally Separated    Spouse Name: N/A    Number of Children: N/A  . Years of Education: N/A   Occupational History  . Not on file.   Social History Main Topics  . Smoking status: Current Everyday Smoker -- 2.0 packs/day    Types: Cigarettes  . Smokeless tobacco: Not on file  . Alcohol Use: Yes  . Drug Use: Not on file  . Sexually Active: Not on file   Other Topics Concern  . Not on file   Social History Narrative   Occupation: vice Engineer, structural, Software engineer Smoker-  2 PPD.Alcohol use-yes,  very rare 1-2 drinksRegular exercise-yesSmoking Status:  currentPacks/Day:  2.0Caffeine use/day:  3-4 dailyDoes Patient Exercise:  yes    Past Surgical History  Procedure Date  . Foot surgery 2001    right foot surgery  . Cholecystectomy 05/2010    Dr Magnus Ivan    Family History  Problem Relation Age of Onset  . Hyperlipidemia Mother   . Arthritis Maternal Grandmother   . Kidney failure Paternal Grandfather   . Alcohol abuse Paternal Grandfather     deceased  . ADD / ADHD Son     No Known Allergies  Current Outpatient Prescriptions on File Prior to Visit  Medication Sig Dispense Refill  . sertraline (ZOLOFT) 100 MG  tablet Take 100 mg by mouth daily.          BP 120/80  Pulse 72  Temp(Src) 97.8 F (36.6 C) (Oral)  Wt 192 lb 1.3 oz (87.127 kg)       Objective:   Physical Exam  Constitutional: He appears well-developed and well-nourished. No distress.  HENT:  Head: Normocephalic and atraumatic.  Right Ear: Tympanic membrane and ear canal normal.  Left Ear: Tympanic membrane and ear canal normal.  Mouth/Throat: No posterior oropharyngeal edema or posterior oropharyngeal erythema.  Cardiovascular: Normal rate and regular rhythm.   No murmur heard. Pulmonary/Chest: No respiratory distress. He has no decreased breath sounds. He has wheezes in the left upper field and the left middle field. He has no rhonchi. He has no rales.  Musculoskeletal: He exhibits no edema.  Lymphadenopathy:    He has no cervical adenopathy.  Skin:       Dry raised lesion on left cheek          Assessment & Plan:

## 2011-04-26 NOTE — Patient Instructions (Signed)
Please complete your lab work. Complete your chest x-ray on the first floor.  Call if your symptoms worsen or if your are not feeling better in 2-3 days.

## 2011-04-26 NOTE — Assessment & Plan Note (Signed)
CXR is performed today and notes changes consistent with bronchitis.  Will plan to treat with zithromax. Start prednisone taper for bronchospasm and add prn albuterol.

## 2011-04-26 NOTE — Assessment & Plan Note (Signed)
He had elevated LFT's earlier this year prior to his cholecystectomy.  He reports rare use of ETOH.  Had abdominal US as part of his GB work up and this demonstrated normal liver parenchyma.  Will order labs as below to further evaluate his elevated.

## 2011-04-26 NOTE — Assessment & Plan Note (Signed)
Concerning for skin cancer. Will refer to dermatology for removal.

## 2011-04-26 NOTE — Assessment & Plan Note (Signed)
Pt was counseled on need to quit smoking.

## 2011-04-26 NOTE — Telephone Encounter (Signed)
Called pt and reviewed cxr results and med plans as outlined in office visit.

## 2011-04-27 LAB — CERULOPLASMIN: Ceruloplasmin: 31 mg/dL (ref 20–60)

## 2011-04-28 ENCOUNTER — Telehealth: Payer: Self-pay | Admitting: Family

## 2011-04-28 DIAGNOSIS — R7989 Other specified abnormal findings of blood chemistry: Secondary | ICD-10-CM

## 2011-04-28 NOTE — Telephone Encounter (Signed)
Spoke with pt. Reviewed A1C borderline diabetes and discussed diabetic diet. Also discussed persistent elevated LFT's and plans to refer him to Dr. Christella Hartigan for further evaluation.

## 2011-05-03 ENCOUNTER — Encounter: Payer: Self-pay | Admitting: Gastroenterology

## 2011-05-23 ENCOUNTER — Other Ambulatory Visit (INDEPENDENT_AMBULATORY_CARE_PROVIDER_SITE_OTHER): Payer: BC Managed Care – PPO

## 2011-05-23 ENCOUNTER — Ambulatory Visit (INDEPENDENT_AMBULATORY_CARE_PROVIDER_SITE_OTHER): Payer: BC Managed Care – PPO | Admitting: Gastroenterology

## 2011-05-23 ENCOUNTER — Encounter: Payer: Self-pay | Admitting: Gastroenterology

## 2011-05-23 VITALS — BP 112/68 | HR 88 | Ht 71.5 in | Wt 192.0 lb

## 2011-05-23 DIAGNOSIS — R7989 Other specified abnormal findings of blood chemistry: Secondary | ICD-10-CM

## 2011-05-23 LAB — IBC PANEL: Transferrin: 249.1 mg/dL (ref 212.0–360.0)

## 2011-05-23 LAB — IGA: IgA: 193 mg/dL (ref 68–378)

## 2011-05-23 NOTE — Patient Instructions (Addendum)
One of your biggest health concerns is your smoking.  This increases your risk for most cancers and serious cardiovascular diseases such as strokes, heart attacks.  You should try your best to stop.  If you need assistance, please contact your PCP or Smoking Cessation Class at [facility] [phone]) or [facility] (1-800-QUIT-NOW). MRI abdomen (with and without IV contrast) with MRCP images  Please arrive at [facility] on 05/25/11 at 645 pm and have nothing to eat or drink after midnight. You will have labs checked today in the basement lab.  Please head down after you check out with the front desk  (ANA, AMA, TIBC, total Iron, alpha 1 antitrypsin, total IgA level, TTG, ASMA.) A copy of this information will be made available to [facility].

## 2011-05-23 NOTE — Progress Notes (Signed)
HPI: This is a  very pleasant 46 year old man whom I am meeting for the first time today.  AST, 60, ALT 139, alkaline phosphatase 139  (2012 AST 49, ALT 117, alkaline phosphatase 141 )Hepatitis C antibody negative, hepatitis B surface antibody negative, hepatitis B surface antigen negative, ceruloplasmin level normal, ferritin level normal. December 2012 abdominal ultrasound showed multiple gallstones in gallbladder.  Life insurance policy 4 months ago, lfts up a bit.  Repeated here.  First heard LFTs were elevated 1 year ago.    Had cholecystectomy lap, 1 year ago.  Was having post prandial pains that have since completely resolved.  Intra-Op cholangiogram was not possible for technical reasons.  Drinks very rarely. Periodic tylenol about 1 gram a day for periodic headaches.  Never Iv drugs, no liver troubles in family.  His wife passed away with inadvertant drug overdose 7 years.  Has 3 sons.  Review of systems: Pertinent positive and negative review of systems were noted in the above HPI section. Complete review of systems was performed and was otherwise normal.    Past Medical History  Diagnosis Date  . History of chicken pox   . History of kidney stones     Past Surgical History  Procedure Date  . Foot surgery 2001    right foot surgery  . Cholecystectomy 05/2010    Dr Magnus Ivan    No current outpatient prescriptions on file.    Allergies as of 05/23/2011  . (No Known Allergies)    Family History  Problem Relation Age of Onset  . Hyperlipidemia Mother   . Arthritis Maternal Grandmother   . Kidney failure Paternal Grandfather   . Alcohol abuse Paternal Grandfather     deceased  . ADD / ADHD Son     adopted    History   Social History  . Marital Status: Legally Separated    Spouse Name: N/A    Number of Children: 3  . Years of Education: N/A   Occupational History  . VP SALES    Social History Main Topics  . Smoking status: Current Everyday Smoker  -- 2.0 packs/day    Types: Cigarettes  . Smokeless tobacco: Never Used  . Alcohol Use: Yes     rarely  . Drug Use: No  . Sexually Active: Not on file   Other Topics Concern  . Not on file   Social History Narrative   Occupation: vice Engineer, structural, Software engineer Smoker-  2 PPD.Alcohol use-yes,  very rare 1-2 drinksRegular exercise-yesSmoking Status:  currentPacks/Day:  2.0Caffeine use/day:  3-4 dailyDoes Patient Exercise:  yes       Physical Exam: BP 112/68  Pulse 88  Ht 5' 11.5" (1.816 m)  Wt 192 lb (87.091 kg)  BMI 26.41 kg/m2 Constitutional: generally well-appearing Psychiatric: alert and oriented x3 Eyes: extraocular movements intact Mouth: oral pharynx moist, no lesions Neck: supple no lymphadenopathy Cardiovascular: heart regular rate and rhythm Lungs: clear to auscultation bilaterally Abdomen: soft, nontender, nondistended, no obvious ascites, no peritoneal signs, normal bowel sounds Extremities: no lower extremity edema bilaterally Skin: no lesions on visible extremities    Assessment and plan: 46 y.o. male with  elevated liver tests  Perhaps he has retained common bile duct stone however he has no biliary symptoms. He will get an MRI of his liver with MRCP images as well. I am interested in establishing whether he has extra fatty liver or biliary process. He will also battery of blood tests for usual workup of elevated  liver tests, see summary below.

## 2011-05-24 LAB — ANTI-NUCLEAR AB-TITER (ANA TITER): ANA Titer 1: NEGATIVE

## 2011-05-24 LAB — TISSUE TRANSGLUTAMINASE, IGA: Tissue Transglutaminase Ab, IgA: 4 U/mL (ref <20)

## 2011-05-24 LAB — ANTI-SMOOTH MUSCLE ANTIBODY, IGG: Smooth Muscle Ab: 5 U (ref <20)

## 2011-05-25 ENCOUNTER — Other Ambulatory Visit: Payer: Self-pay | Admitting: Gastroenterology

## 2011-05-25 ENCOUNTER — Ambulatory Visit (HOSPITAL_COMMUNITY)
Admission: RE | Admit: 2011-05-25 | Discharge: 2011-05-25 | Disposition: A | Discharge status: Home / Self Care | Payer: BC Managed Care – PPO | Source: Ambulatory Visit | Attending: Gastroenterology | Admitting: Gastroenterology

## 2011-05-25 DIAGNOSIS — R7989 Other specified abnormal findings of blood chemistry: Secondary | ICD-10-CM | POA: Insufficient documentation

## 2011-05-25 DIAGNOSIS — Z1389 Encounter for screening for other disorder: Secondary | ICD-10-CM | POA: Insufficient documentation

## 2011-05-25 DIAGNOSIS — Z9089 Acquired absence of other organs: Secondary | ICD-10-CM | POA: Insufficient documentation

## 2011-05-25 MED ORDER — GADOBENATE DIMEGLUMINE 529 MG/ML IV SOLN
18.0000 mL | Freq: Once | INTRAVENOUS | Status: AC | PRN
  Administered 2011-05-25: 18 mL via INTRAVENOUS

## 2011-05-26 ENCOUNTER — Other Ambulatory Visit: Payer: Self-pay

## 2011-05-26 DIAGNOSIS — K760 Fatty (change of) liver, not elsewhere classified: Secondary | ICD-10-CM

## 2011-07-18 ENCOUNTER — Encounter: Payer: Self-pay | Admitting: Gastroenterology

## 2011-08-28 ENCOUNTER — Telehealth: Payer: Self-pay

## 2011-08-28 NOTE — Telephone Encounter (Signed)
Message copied by Donata Duff on Mon Aug 28, 2011  9:59 AM ------      Message from: Donata Duff      Created: Fri May 26, 2011 10:32 AM       Pt to get labs

## 2011-08-29 NOTE — Telephone Encounter (Signed)
Pt reminded to have lab results

## 2011-12-07 ENCOUNTER — Encounter: Payer: Self-pay | Admitting: Family Medicine

## 2011-12-07 ENCOUNTER — Ambulatory Visit (INDEPENDENT_AMBULATORY_CARE_PROVIDER_SITE_OTHER): Payer: BC Managed Care – PPO | Admitting: Family Medicine

## 2011-12-07 VITALS — BP 122/80 | HR 89 | Temp 97.9°F | Ht 70.5 in | Wt 194.0 lb

## 2011-12-07 DIAGNOSIS — R42 Dizziness and giddiness: Secondary | ICD-10-CM

## 2011-12-07 LAB — GLUCOSE, POCT (MANUAL RESULT ENTRY): POC Glucose: 109 mg/dl — AB (ref 70–99)

## 2011-12-07 LAB — POCT HEMOGLOBIN: Hemoglobin: 14.9 g/dL (ref 14.1–18.1)

## 2011-12-07 NOTE — Assessment & Plan Note (Signed)
Most likely orthostatic hypotension, mild dysautonomia.   Reassured pt today, reviewed educational handout with him and gave it to him to take home. Encouraged him to be more mindful of fluid intake and increase salt intake. No further w/u indicated at this time. F/u in 54mo with his primary provider to see how things are going.

## 2011-12-07 NOTE — Progress Notes (Signed)
OFFICE VISIT  12/07/2011   CC:  Chief Complaint  Patient presents with  . Dizziness    off and on x 2 months, increasing in frequency in last 2 weeks, daily x 1 week     HPI:    Patient is a 46 y.o. Caucasian male who presents for dizziness.  He is a patient of Sandford Craze at [employer] office.  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  Married and has 3 boys (12, 46, 44 y/o). Describes about 2 mo hx of off/on feeling of lightheadedness--infrequent symptoms initially but last 2 wks more regular, and the last 1 wk daily.  Seems orthostatic by his description.  Lasts seconds only. Sometimes vertigo feeling related to getting up from sitting or lying supine--but this is less often than his vague feeling of dizziness/lightheadedness.  No nausea. No unusual stresses lately, no HA's.  Going on vac tomorrow.   NOtes no trigger usually.  Being still helps a little.  No signif change in sleep/wake routine. Quit smoking 5 mo ago.  No alcohol or drugs.  ROS: no tinnitis, no hearing deficits, no acute vision changes, no excessive thirst or urination. No urinary or GI complaints.  No fevers.  No depression.  No drastic change in exercise.    Past Medical History  Diagnosis Date  . History of chicken pox   . History of kidney stones     Past Surgical History  Procedure Date  . Foot surgery 2001    right foot surgery  . Cholecystectomy 05/2010    Dr Magnus Ivan   MEDS: none  No Known Allergies  ROS As per HPI  PE: Blood pressure 122/80, pulse 89, temperature 97.9 F (36.6 C), temperature source Temporal, height 5' 10.5" (1.791 m), weight 194 lb (87.998 kg), SpO2 96.00%. Gen: Alert, well appearing.  Patient is oriented to person, place, time, and situation. Affect: pleasant, lucid thought and speech, calm. ENT:   Eyes: no injection, icteris, swelling, or exudate.  EOMI, PERRLA. Nose: no drainage or turbinate edema/swelling.  No injection or focal  lesion.  Mouth: lips without lesion/swelling.  Oral mucosa pink and moist.  Dentition intact and without obvious caries or gingival swelling.  Oropharynx without erythema, exudate, or swelling.  Neck: supple/nontender.  No LAD, mass, or TM.  Carotid pulses 2+ bilaterally, without bruits. CV: RRR, no m/r/g.   LUNGS: CTA bilat, nonlabored resps, good aeration in all lung fields. ABD: soft, NT, ND, BS normal.  No hepatospenomegaly or mass.  No bruits. EXT: no clubbing, cyanosis, or edema.  Neuro: CN 2-12 intact bilaterally, strength 5/5 in proximal and distal upper extremities and lower extremities bilaterally.  No sensory deficits.  No tremor.  No disdiadochokinesis.  No ataxia.  Upper extremity and lower extremity DTRs symmetric.  No pronator drift.  LABS:  POCT Hb: 14.9 POCT glucose: 109 (last ate/drank about 2 hrs ago) 12 lead EKG: NSR, rate 73, no ectopy, no ST changes, normal axis and intervals and voltages.  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   Reassured pt today, reviewed educational handout with him and gave it to him to take home. Encouraged him to be more mindful of fluid intake and increase salt intake. No further w/u indicated at this time. F/u in 47mo with his primary provider to see how things are going.    FOLLOW UP: Return in about 4 weeks (around 01/04/2012) for with Sandford Craze at  [facility] to f/u dizziness.      2

## 2011-12-07 NOTE — Patient Instructions (Signed)
Orthostatic Hypotension Orthostatic hypotension is a sudden fall in blood pressure. It occurs when a person goes from a sitting or lying position to a standing position. CAUSES   Loss of body fluids (dehydration).   Medicines that lower blood pressure.   Sudden changes in posture, such as sudden standing when you have been sitting or lying down.   Taking too much of your medicine.  SYMPTOMS   Lightheadedness or dizziness.   Fainting or near-fainting.   A fast heart rate (tachycardia).   Weakness.   Feeling tired (fatigue).  DIAGNOSIS  Your caregiver may find the cause of orthostatic hypotension through:  A history and/or physical exam.   Checking your blood pressure. Your caregiver will check your blood pressure when you are:   Lying down.   Sitting.   Standing.   Tilt table testing. In this test, you are placed on a table that goes from a lying position to a standing position. You will be strapped to the table. This test helps to monitor your blood pressure and heart rate when you are in different positions.  TREATMENT   If orthostatic hypotension is caused by your medicines, your caregiver will need to adjust your dosage. Do not stop or adjust your medicine on your own.   When changing positions, make these changes slowly. This allows your body to adjust to the different position.   Compression stockings that are worn on your lower legs may be helpful.   Your caregiver may have you consume extra salt. Do not add extra salt to your diet unless directed by your caregiver.   Eat frequent, small meals. Avoid sudden standing after eating.   Avoid hot showers or excessive heat.   Your caregiver may give you fluids through the vein (intravenous).   Your caregiver may put you on medicine to help enhance fluid retention.  SEEK IMMEDIATE MEDICAL CARE IF:   You faint or have a near-fainting episode. Call your local emergency services (911 in [place]   You have or  develop chest pain.   You feel sick to your stomach (nauseous) or vomit.   You have a loss of feeling or movement in your arms or legs.   You have difficulty talking, slurred speech, or you are unable to talk.   You have difficulty thinking or have confused thinking.  MAKE SURE YOU:   Understand these instructions.   Will watch your condition.   Will get help right away if you are not doing well or get worse.  Document Released: 04/14/2002 Document Revised: 04/13/2011 Document Reviewed: 08/07/2008 [employer] Patient Information 2012 [employer]. 

## 2012-06-22 ENCOUNTER — Other Ambulatory Visit: Payer: Self-pay

## 2012-08-13 ENCOUNTER — Encounter: Payer: Self-pay | Admitting: Gastroenterology

## 2013-03-13 ENCOUNTER — Other Ambulatory Visit: Payer: Self-pay

## 2013-06-18 ENCOUNTER — Emergency Department (HOSPITAL_COMMUNITY)
Admission: EM | Admit: 2013-06-18 | Discharge: 2013-06-18 | Disposition: A | Discharge status: Home / Self Care | Payer: Self-pay | Attending: Emergency Medicine | Admitting: Emergency Medicine

## 2013-06-18 ENCOUNTER — Emergency Department (HOSPITAL_COMMUNITY): Payer: BC Managed Care – PPO

## 2013-06-18 ENCOUNTER — Encounter (HOSPITAL_COMMUNITY): Payer: Self-pay | Admitting: Emergency Medicine

## 2013-06-18 DIAGNOSIS — R079 Chest pain, unspecified: Secondary | ICD-10-CM | POA: Insufficient documentation

## 2013-06-18 DIAGNOSIS — R42 Dizziness and giddiness: Secondary | ICD-10-CM | POA: Insufficient documentation

## 2013-06-18 DIAGNOSIS — R0602 Shortness of breath: Secondary | ICD-10-CM | POA: Insufficient documentation

## 2013-06-18 DIAGNOSIS — Z8619 Personal history of other infectious and parasitic diseases: Secondary | ICD-10-CM | POA: Insufficient documentation

## 2013-06-18 DIAGNOSIS — Z87891 Personal history of nicotine dependence: Secondary | ICD-10-CM | POA: Insufficient documentation

## 2013-06-18 DIAGNOSIS — Z87442 Personal history of urinary calculi: Secondary | ICD-10-CM | POA: Insufficient documentation

## 2013-06-18 LAB — CBC
HEMATOCRIT: 45.7 % (ref 39.0–52.0)
HEMOGLOBIN: 15.8 g/dL (ref 13.0–17.0)
MCH: 31.5 pg (ref 26.0–34.0)
MCHC: 34.6 g/dL (ref 30.0–36.0)
MCV: 91 fL (ref 78.0–100.0)
Platelets: 169 10*3/uL (ref 150–400)
RBC: 5.02 MIL/uL (ref 4.22–5.81)
RDW: 12.7 % (ref 11.5–15.5)
WBC: 10.3 10*3/uL (ref 4.0–10.5)

## 2013-06-18 LAB — BASIC METABOLIC PANEL
BUN: 12 mg/dL (ref 6–23)
CALCIUM: 9.6 mg/dL (ref 8.4–10.5)
CO2: 26 mEq/L (ref 19–32)
Chloride: 104 mEq/L (ref 96–112)
Creatinine, Ser: 0.86 mg/dL (ref 0.50–1.35)
GFR calc Af Amer: >90 mL/min (ref >90)
GFR calc non Af Amer: >90 mL/min (ref >90)
GLUCOSE: 101 mg/dL — AB (ref 70–99)
Potassium: 4.4 mEq/L (ref 3.7–5.3)
SODIUM: 143 meq/L (ref 137–147)

## 2013-06-18 LAB — TROPONIN I: Troponin I: <0.3 ng/mL (ref <0.30)

## 2013-06-18 LAB — POCT I-STAT TROPONIN I: TROPONIN I, POC: 0 ng/mL (ref 0.00–0.08)

## 2013-06-18 MED ORDER — ASPIRIN 325 MG PO TABS
325.0000 mg | ORAL_TABLET | Freq: Once | ORAL | Status: AC
  Administered 2013-06-18: 325 mg via ORAL
  Filled 2013-06-18: qty 1

## 2013-06-18 NOTE — ED Provider Notes (Signed)
CSN: [id number]     Arrival date & time 06/18/13  1134 History   First MD Initiated Contact with Patient 06/18/13 1213     Chief Complaint  Patient presents with  . Dizziness  . Chest Pain     (Consider location/radiation/quality/duration/timing/severity/associated sxs/prior Treatment) Patient is a 48 y.o. male presenting with dizziness and chest pain.  Dizziness Associated symptoms: shortness of breath   Associated symptoms: no chest pain, no nausea, no palpitations and no vomiting   Chest Pain Associated symptoms: shortness of breath   Associated symptoms: no back pain, no dizziness, no nausea, no numbness, no palpitations, not vomiting and no weakness    48 yo male with a 50+ pack year hx of smoking presents today with gradual onset of chest tightness while at rest that started this morning around 10 am. Patient states tightness has gradually worsening staring at a 2/10 and reaching 4/10. Denies radiation. Admits to associated SOB initially but now resolved. Patient denies tightness worsening with activity. Patient admits to facial flushing but denies diaphoresis. Admits to lightheadedness with standing. Patient denies any PMH. States he had some borderliine elevated cholesterol but never been started on Medication for it. Patient denies taking any medication for sxs. 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   Advanced age > 46 yo: No HTN: No Hyperlipidemia: No Cigarette smoking: Yes Diabetes Mellitus: No Family hx of CAD or MI < 73 yo : No Male or Post menopausal: Yes Cocaine use: No Prior MI: No CABG: No Stress test: No Angina: Yes  HR > 100 bpm: No O2 sat on RA < 95%: No Prior hx of venous thromboembolism:No Trauma or surgery in past 4 wks:No Hemoptysis:No Exogenous Estrogen use:No Unilateral Leg swelling: No Pre tests probability for PE < 15%:No  Past Medical History  Diagnosis  Date  . History of chicken pox   . History of kidney stones    Past Surgical History  Procedure Laterality Date  . Foot surgery  2001    right foot surgery  . Cholecystectomy  05/2010    Dr Ninfa Linden   Family History  Problem Relation Age of Onset  . Hyperlipidemia Mother   . Arthritis Maternal Grandmother   . Kidney failure Paternal Grandfather   . Alcohol abuse Paternal Grandfather     deceased  . ADD / ADHD Son     adopted   History  Substance Use Topics  . Smoking status: Former Smoker -- 2.00 packs/day    Types: Cigarettes    Quit date: 07/07/2011  . Smokeless tobacco: Never Used     Comment: using eCig 12/07/11  . Alcohol Use: Yes     Comment: rarely    Review of Systems  Respiratory: Positive for chest tightness and shortness of breath.   Cardiovascular: Negative for chest pain, palpitations and leg swelling.  Gastrointestinal: Negative for nausea and vomiting.  Musculoskeletal: Negative for back pain and neck pain.  Neurological: Positive for light-headedness. Negative for dizziness, weakness and numbness.  All other systems reviewed and are negative.      Allergies  Review of patient's allergies indicates no known allergies.  Home Medications   Current Outpatient Rx  Name  Route  Sig  Dispense  Refill  . IBUPROFEN PO   Oral   Take 2 tablets by mouth every 6 (six) hours as needed (headache).  BP 103/69  Pulse 77  Temp(Src) 98.4 F (36.9 C) (Oral)  Resp 16  SpO2 99% Physical Exam  Nursing note and vitals reviewed. Constitutional: He is oriented to person, place, and time. He appears well-developed and well-nourished. No distress.  HENT:  Head: Normocephalic and atraumatic.  Eyes: Conjunctivae are normal.  Neck: Neck supple. No JVD present. No tracheal deviation present.  Cardiovascular: Normal rate and regular rhythm.  Exam reveals no gallop and no friction rub.   No murmur heard. Pulmonary/Chest: Effort normal. No respiratory  distress. He has no wheezes. He has no rhonchi. He has no rales.  Musculoskeletal: Normal range of motion. He exhibits no edema.  Neurological: He is alert and oriented to person, place, and time.  Skin: Skin is warm and dry. He is not diaphoretic.  Psychiatric: He has a normal mood and affect. His behavior is normal.    ED Course  Procedures (including critical care time) Labs Review Labs Reviewed  BASIC METABOLIC PANEL - Abnormal; Notable for the following:    Glucose, Bld 101 (*)    All other components within normal limits  CBC  TROPONIN I  POCT I-STAT TROPONIN I   Imaging Review Dg Chest 2 View  06/18/2013   CLINICAL DATA:  Dizziness, chest pain, history smoking  EXAM: CHEST  2 VIEW  COMPARISON:  04/26/2011  FINDINGS: Normal heart size, mediastinal contours, and pulmonary vascularity.  Lungs clear.  Minimal central peribronchial thickening.  No pleural effusion or pneumothorax.  Bones unremarkable.  IMPRESSION: Minimal chronic bronchitic changes.  No acute abnormalities.   Electronically Signed   By: Lavonia Dana M.D.   On: 06/18/2013 12:49    EKG Interpretation    Date/Time:  Wednesday June 18 2013 11:38:51 EST Ventricular Rate:  87 PR Interval:  122 QRS Duration: 94 QT Interval:  376 QTC Calculation: 452 R Axis:   66 Text Interpretation:  Normal sinus rhythm Nonspecific ST abnormality Abnormal ECG Confirmed by ALLEN  MD, ANTHONY ([id number]) on 06/18/2013 12:43:56 PM            MDM   Final diagnoses:  Chest pain at rest   Patient afebrile with normal VS.  PERC negative. EKG shows NSR with nonspecific ST abnormality. CXR negative for acute findings. Troponin negative.  CBC -WNL BMP- WNL  Plan to get delta troponin.  Patient discussed with Dr. Zenia Resides. Will consult Cardiology.   Delta troponin negative. Cardiology recommends patient follow up outpatient in office. Patient in NAD, appears stable for discharge.  Plan to have patient follow up in 1-2 days with  Cardiology for further evaluation. Findings discussed with patient. Patient agrees with plan. Discharged in good condition  Meds given in ED:  Medications  aspirin tablet 325 mg (325 mg Oral Given 06/18/13 1301)    Discharge Medication List as of 06/18/2013  5:02 PM          Sherrie George, PA-C 06/19/13 1048

## 2013-06-18 NOTE — Discharge Instructions (Signed)
Make a follow up appointment with Cardiology in 2 days. Return to ED should your symptoms return or you develop worsening chest pain, shortness of breath or fever/chills.    Chest Pain (Nonspecific) It is often hard to give a specific diagnosis for the cause of chest pain. There is always a chance that your pain could be related to something serious, such as a heart attack or a blood clot in the lungs. You need to follow up with your caregiver for further evaluation. CAUSES   Heartburn.  Pneumonia or bronchitis.  Anxiety or stress.  Inflammation around your heart (pericarditis) or lung (pleuritis or pleurisy).  A blood clot in the lung.  A collapsed lung (pneumothorax). It can develop suddenly on its own (spontaneous pneumothorax) or from injury (trauma) to the chest.  Shingles infection (herpes zoster virus). The chest wall is composed of bones, muscles, and cartilage. Any of these can be the source of the pain.  The bones can be bruised by injury.  The muscles or cartilage can be strained by coughing or overwork.  The cartilage can be affected by inflammation and become sore (costochondritis). DIAGNOSIS  Lab tests or other studies, such as X-rays, electrocardiography, stress testing, or cardiac imaging, may be needed to find the cause of your pain.  TREATMENT   Treatment depends on what may be causing your chest pain. Treatment may include:  Acid blockers for heartburn.  Anti-inflammatory medicine.  Pain medicine for inflammatory conditions.  Antibiotics if an infection is present.  You may be advised to change lifestyle habits. This includes stopping smoking and avoiding alcohol, caffeine, and chocolate.  You may be advised to keep your head raised (elevated) when sleeping. This reduces the chance of acid going backward from your stomach into your esophagus.  Most of the time, nonspecific chest pain will improve within 2 to 3 days with rest and mild pain  medicine. HOME CARE INSTRUCTIONS   If antibiotics were prescribed, take your antibiotics as directed. Finish them even if you start to feel better.  For the next few days, avoid physical activities that bring on chest pain. Continue physical activities as directed.  Do not smoke.  Avoid drinking alcohol.  Only take over-the-counter or prescription medicine for pain, discomfort, or fever as directed by your caregiver.  Follow your caregiver's suggestions for further testing if your chest pain does not go away.  Keep any follow-up appointments you made. If you do not go to an appointment, you could develop lasting (chronic) problems with pain. If there is any problem keeping an appointment, you must call to reschedule. SEEK MEDICAL CARE IF:   You think you are having problems from the medicine you are taking. Read your medicine instructions carefully.  Your chest pain does not go away, even after treatment.  You develop a rash with blisters on your chest. SEEK IMMEDIATE MEDICAL CARE IF:   You have increased chest pain or pain that spreads to your arm, neck, jaw, back, or abdomen.  You develop shortness of breath, an increasing cough, or you are coughing up blood.  You have severe back or abdominal pain, feel nauseous, or vomit.  You develop severe weakness, fainting, or chills.  You have a fever. THIS IS AN EMERGENCY. Do not wait to see if the pain will go away. Get medical help at once. Call your local emergency services (911 in [place] Do not drive yourself to the hospital. MAKE SURE YOU:   Understand these instructions.  Will watch  your condition.  Will get help right away if you are not doing well or get worse. Document Released: 02/01/2005 Document Revised: 07/17/2011 Document Reviewed: 11/28/2007 [facility] Patient Information 2014 [place].

## 2013-06-18 NOTE — H&P (Signed)
Patient seen, examined. Available data reviewed. Agree with findings, assessment, and plan as outlined by Tarri Fuller. Exam reveals an alert, oriented gentleman in no distress. Lungs are clear, heart is regular rate and rhythm without murmurs or gallops, abdomen is soft nontender, extremities show no edema. There were no carotid bruits. EKG is within normal limits. Initial troponins are negative. He has not had any exertional angina. His episode today had some typical and atypical features. Headaches and symptoms have completely resolved and enzymes are negative, he can be safely discharged from the emergency room with an outpatient exercise treadmill study. We will repeat a second set of cardiac markers 6 hours from the initial troponin.  Sherren Mocha, M.D. 06/18/2013 4:26 PM

## 2013-06-18 NOTE — H&P (Signed)
Rasheem Figiel is an 48 y.o. male.   Chief Complaint: CP HPI: 48 yo male with history of tobacco abuse and self diagnosed acid reflux.  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 Works  ~60 hrs/week.  Presents with chest tightness(2-4/10) which occurred at 10am.  Associated dizziness and flushing.  No radiation of pain, N/V.  No acute EKG changes.  Initial troponin negative.  CP resolved once in the ER.  ASA given.  No family history of CAD.  Mom and Dad are living and healthy.  Medications:  None  Past Medical History  Diagnosis Date  . History of chicken pox   . History of kidney stones     Past Surgical History  Procedure Laterality Date  . Foot surgery  2001    right foot surgery  . Cholecystectomy  05/2010    Dr Ninfa Linden    Family History  Problem Relation Age of Onset  . Hyperlipidemia Mother   . Arthritis Maternal Grandmother   . Kidney failure Paternal Grandfather   . Alcohol abuse Paternal Grandfather     deceased  . ADD / ADHD Son     adopted   Social History:  reports that he quit smoking about 1 years ago. His smoking use included Cigarettes. He smoked 2.00 packs per day. He has never used smokeless tobacco. He reports that he drinks alcohol. He reports that he does not use illicit drugs.  Allergies: No Known Allergies   (Not in a hospital admission)  Results for orders placed during the hospital encounter of 06/18/13 (from the past 48 hour(s))  CBC     Status: None   Collection Time    06/18/13 12:30 PM      Result Value Ref Range   WBC 10.3  4.0 - 10.5 K/uL   RBC 5.02  4.22 - 5.81 MIL/uL   Hemoglobin 15.8  13.0 - 17.0 g/dL   HCT 45.7  39.0 - 52.0 %   MCV 91.0  78.0 - 100.0 fL   MCH 31.5  26.0 - 34.0 pg   MCHC 34.6  30.0 - 36.0 g/dL   RDW 12.7  11.5 - 15.5 %   Platelets 169  150 - 400 K/uL  BASIC METABOLIC PANEL     Status: Abnormal   Collection Time    06/18/13 12:30 PM      Result Value Ref Range   Sodium 143  137 - 147 mEq/L    Potassium 4.4  3.7 - 5.3 mEq/L   Chloride 104  96 - 112 mEq/L   CO2 26  19 - 32 mEq/L   Glucose, Bld 101 (*) 70 - 99 mg/dL   BUN 12  6 - 23 mg/dL   Creatinine, Ser 0.86  0.50 - 1.35 mg/dL   Calcium 9.6  8.4 - 10.5 mg/dL   GFR calc non Af Amer >90  >90 mL/min   GFR calc Af Amer >90  >90 mL/min   Comment: (NOTE)     The eGFR has been calculated using the CKD EPI equation.     This calculation has not been validated in all clinical situations.     eGFR's persistently <90 mL/min signify possible Chronic Kidney     Disease.  POCT I-STAT TROPONIN I     Status: None   Collection Time    06/18/13 12:51 PM      Result Value Ref Range   Troponin i, poc 0.00  0.00 - 0.08 ng/mL  Comment 3            Comment: Due to the release kinetics of cTnI,     a negative result within the first hours     of the onset of symptoms does not rule out     myocardial infarction with certainty.     If myocardial infarction is still suspected,     repeat the test at appropriate intervals.   Dg Chest 2 View  06/18/2013   CLINICAL DATA:  Dizziness, chest pain, history smoking  EXAM: CHEST  2 VIEW  COMPARISON:  04/26/2011  FINDINGS: Normal heart size, mediastinal contours, and pulmonary vascularity.  Lungs clear.  Minimal central peribronchial thickening.  No pleural effusion or pneumothorax.  Bones unremarkable.  IMPRESSION: Minimal chronic bronchitic changes.  No acute abnormalities.   Electronically Signed   By: Lavonia Dana M.D.   On: 06/18/2013 12:49    Review of Systems  Constitutional: Negative for fever and diaphoresis.       Flushing.  HENT: Negative for congestion and sore throat.   Respiratory: Negative for cough and shortness of breath.   Cardiovascular: Positive for chest pain ("tightness"). Negative for orthopnea and leg swelling.  Gastrointestinal: Negative for nausea, vomiting, blood in stool and melena.  Genitourinary: Negative for hematuria.  Musculoskeletal: Negative for myalgias.   Neurological: Positive for dizziness.  All other systems reviewed and are negative.    Blood pressure 131/81, pulse 77, temperature 97.3 F (36.3 C), resp. rate 18, SpO2 98.00%. Physical Exam   Assessment/Plan Chest pain Tobacco abuse  Plan: 48 yo male with history of tobacco abuse and self diagnosed acid reflux.  Presents with chest tightness which occurred at 10am.  Associated dizziness and flushing.  No radiation of pain, N/V.  No acute EKG changes.  Initial troponin negative.  We will draw a second troponin at 1600hrs and if negative, DC home with exercise stress test in the office.  Discussed tobacco cessation 3-66mns.  Daesean Lazarz 06/18/2013, 2:23 PM

## 2013-06-18 NOTE — ED Provider Notes (Signed)
Medical screening examination/treatment/procedure(s) were conducted as a shared visit with non-physician practitioner(s) and myself.  I personally evaluated the patient during the encounter.  EKG Interpretation    Date/Time:  Wednesday June 18 2013 11:38:51 EST Ventricular Rate:  87 PR Interval:  122 QRS Duration: 94 QT Interval:  376 QTC Calculation: 452 R Axis:   66 Text Interpretation:  Normal sinus rhythm Nonspecific ST abnormality Abnormal ECG Confirmed by Terrill Wauters  MD, Hager Compston ([id number]) on 06/18/2013 12:43:56 PM           Patient here with 30 minutes of chest pressure that began at rest with associated flushing and near syncope that occurred when he tried to stand up. Patient's EKG without signs of acute infarction. Will have cardiology come and see.  Leota Jacobsen, MD 06/18/13 1259

## 2013-06-18 NOTE — ED Notes (Signed)
Per pt sts he has been lightheaded for the past couple of days and chest pain that started today. sts mid sternal chest pain that is non radiating. sts a little SOB.

## 2013-06-18 NOTE — ED Notes (Signed)
Per Cardiologist- draw Troponin at 1600-if negative pt may be DC'd.  This RN advise Dr Zenia Resides.

## 2013-06-19 ENCOUNTER — Telehealth: Payer: Self-pay | Admitting: Cardiovascular Disease

## 2013-06-19 ENCOUNTER — Other Ambulatory Visit: Payer: Self-pay | Admitting: Nurse Practitioner

## 2013-06-19 DIAGNOSIS — R0789 Other chest pain: Secondary | ICD-10-CM

## 2013-06-19 NOTE — Telephone Encounter (Signed)
New Message  Pt was seen in the hospital by Dr. Burt Knack. Per pt wife he was supposed to have a stress test no order in the system.. Do you want to order stress test or will the pt need an OV. Please call pt's wife to advise.

## 2013-06-19 NOTE — Telephone Encounter (Signed)
Exercise treadmill test with Richardson Dopp, PA-C or Truitt Merle, NP ordered per Dr. Burt Knack.  I sent a message to [facility] to call patient to schedule.  No follow-up appointment will be needed unless test is abnormal.

## 2013-06-20 NOTE — ED Provider Notes (Signed)
Medical screening examination/treatment/procedure(s) were conducted as a shared visit with non-physician practitioner(s) and myself.  I personally evaluated the patient during the encounter.  EKG Interpretation    Date/Time:  Wednesday June 18 2013 11:38:51 EST Ventricular Rate:  87 PR Interval:  122 QRS Duration: 94 QT Interval:  376 QTC Calculation: 452 R Axis:   66 Text Interpretation:  Normal sinus rhythm Nonspecific ST abnormality Abnormal ECG Confirmed by Zenia Resides  MD, Yani Coventry ([id number]) on 06/18/2013 12:43:56 PM             Leota Jacobsen, MD 06/20/13 2018

## 2013-06-25 ENCOUNTER — Encounter: Payer: BC Managed Care – PPO | Admitting: Physician Assistant

## 2013-06-27 ENCOUNTER — Encounter: Payer: Self-pay | Admitting: Physician Assistant

## 2013-07-16 NOTE — Telephone Encounter (Signed)
Left message on machine for pt to contact the office. Trying to move pt't treadmill up to either 12 or 1 the same day per Aura Dials has doctor appt and Joellen Jersey is aware

## 2013-08-12 ENCOUNTER — Encounter: Payer: Self-pay | Admitting: Nurse Practitioner

## 2014-04-01 ENCOUNTER — Ambulatory Visit (HOSPITAL_BASED_OUTPATIENT_CLINIC_OR_DEPARTMENT_OTHER)
Admission: RE | Admit: 2014-04-01 | Discharge: 2014-04-01 | Disposition: A | Discharge status: Home / Self Care | Payer: Self-pay | Source: Ambulatory Visit | Attending: Medical | Admitting: Medical

## 2014-04-01 ENCOUNTER — Encounter: Payer: Self-pay | Admitting: Medical

## 2014-04-01 ENCOUNTER — Ambulatory Visit (INDEPENDENT_AMBULATORY_CARE_PROVIDER_SITE_OTHER): Payer: Self-pay | Admitting: Medical

## 2014-04-01 VITALS — BP 130/82 | HR 85 | Temp 98.3°F | Ht 70.0 in | Wt 199.2 lb

## 2014-04-01 DIAGNOSIS — R059 Cough, unspecified: Secondary | ICD-10-CM

## 2014-04-01 DIAGNOSIS — R05 Cough: Secondary | ICD-10-CM | POA: Insufficient documentation

## 2014-04-01 DIAGNOSIS — Z720 Tobacco use: Secondary | ICD-10-CM

## 2014-04-01 DIAGNOSIS — J209 Acute bronchitis, unspecified: Secondary | ICD-10-CM

## 2014-04-01 DIAGNOSIS — R062 Wheezing: Secondary | ICD-10-CM

## 2014-04-01 DIAGNOSIS — F17200 Nicotine dependence, unspecified, uncomplicated: Secondary | ICD-10-CM

## 2014-04-01 MED ORDER — PREDNISONE 20 MG PO TABS: ORAL_TABLET | ORAL | Status: DC

## 2014-04-01 MED ORDER — AZITHROMYCIN 250 MG PO TABS: ORAL_TABLET | ORAL | Status: DC

## 2014-04-01 MED ORDER — HYDROCOD POLST-CHLORPHEN POLST 10-8 MG/5ML PO LQCR: 5.0000 mL | Freq: Two times a day (BID) | ORAL | Status: DC | PRN

## 2014-04-01 MED ORDER — ALBUTEROL SULFATE HFA 108 (90 BASE) MCG/ACT IN AERS: 2.0000 | INHALATION_SPRAY | Freq: Four times a day (QID) | RESPIRATORY_TRACT | Status: DC | PRN

## 2014-04-01 MED ORDER — BECLOMETHASONE DIPROPIONATE 40 MCG/ACT IN AERS: 2.0000 | INHALATION_SPRAY | Freq: Two times a day (BID) | RESPIRATORY_TRACT | Status: DC

## 2014-04-01 MED ORDER — BUPROPION HCL ER (SR) 150 MG PO TB12: ORAL_TABLET | ORAL | Status: DC

## 2014-04-01 NOTE — Assessment & Plan Note (Signed)
You likely have bronchitis and by your heavy smoking history you may have early copd.  I am prescrbing tussionex for cough, Azithromycin antibiotic, 2 inhalers, and prednisone.  I do want to get a cxr today

## 2014-04-01 NOTE — Assessment & Plan Note (Signed)
For your smoking cessation attempt. I will make wellbutrin available

## 2014-04-01 NOTE — Assessment & Plan Note (Signed)
2 inhalers(qvar and albuterol), and prednisone tabs.

## 2014-04-01 NOTE — Progress Notes (Signed)
Subjective:    Patient ID: Jonathan Powell, male    DOB: [birth year], 48 y.o.   MRN: [id number]  HPI   Pt in states he has been sick recently. Pt is a smoker. Pt states coughing for 30 days. Last 2 days is worse. Coughing a lot at night. Pt smoked 30 yrs 2 pack a day. Pt has used nicotine patch since this Friday. Pt mom sick in hospital with copd issues.  No fever or chills. No sinus pressure. Maybe mild nasal congestion.  Pt wife thinks he wheeze little at night. Also in past when smoked a lot would wheeze.  Past Medical History  Diagnosis Date  . History of chicken pox   . History of kidney stones     History   Social History  . Marital Status: Legally Separated    Spouse Name: N/A    Number of Children: 3  . Years of Education: N/A   Occupational History  . VP SALES    Social History Main Topics  . Smoking status: Former Smoker -- 2.00 packs/day    Types: Cigarettes    Quit date: 07/07/2011  . Smokeless tobacco: Never Used     Comment: using eCig 12/07/11  . Alcohol Use: Yes     Comment: rarely  . Drug Use: No  . Sexual Activity: Not on file   Other Topics Concern  . Not on file   Social History Narrative   Occupation: vice Agricultural engineer, security company   Current Smoker-  2 PPD.   Alcohol use-yes,  very rare 1-2 drinks   Regular exercise-yes   Smoking Status:  current   Packs/Day:  2.0   Caffeine use/day:  3-4 daily   Does Patient Exercise:  yes    Past Surgical History  Procedure Laterality Date  . Foot surgery  2001    right foot surgery  . Cholecystectomy  05/2010    Dr Ninfa Linden    Family History  Problem Relation Age of Onset  . Hyperlipidemia Mother   . Arthritis Maternal Grandmother   . Kidney failure Paternal Grandfather   . Alcohol abuse Paternal Grandfather     deceased  . ADD / ADHD Son     adopted    No Known Allergies  Current Outpatient Prescriptions on File Prior to Visit  Medication Sig Dispense Refill  . IBUPROFEN  PO Take 2 tablets by mouth every 6 (six) hours as needed (headache).     No current facility-administered medications on file prior to visit.    BP 130/82 mmHg  Pulse 85  Temp(Src) 98.3 F (36.8 C) (Oral)  Ht 5\' 10"  (1.778 m)  Wt 199 lb 3.2 oz (90.357 kg)  BMI 28.58 kg/m2  SpO2 94%    Review of Systems  Constitutional: Negative for fever, chills and fatigue.  HENT: Positive for congestion. Negative for ear discharge, ear pain, nosebleeds, postnasal drip, rhinorrhea, sinus pressure, sneezing, sore throat and trouble swallowing.   Respiratory: Positive for cough and wheezing. Negative for chest tightness and shortness of breath.   Cardiovascular: Negative for chest pain and palpitations.  Musculoskeletal: Negative for back pain.  Neurological: Negative for dizziness, tremors, seizures, syncope, facial asymmetry, weakness, light-headedness, numbness and headaches.  Hematological: Negative for adenopathy. Does not bruise/bleed easily.  Psychiatric/Behavioral: Negative for suicidal ideas and dysphoric mood.       Objective:   Physical Exam   General  Mental Status - Alert. General Appearance - Well groomed. Not in acute  distress.  Skin Rashes- No Rashes.  HEENT Head- Normal. Ear Auditory Canal - Left- Normal. Right - Normal.Tympanic Membrane- Left- Normal. Right- Normal. Eye Sclera/Conjunctiva- Left- Normal. Right- Normal. Nose & Sinuses Nasal Mucosa- Left- Not Boggy or Congested. Right- Not Boggy or Congested. Mouth & Throat Lips: Upper Lip- Normal: no dryness, cracking, pallor, cyanosis, or vesicular eruption. Lower Lip-Normal: no dryness, cracking, pallor, cyanosis or vesicular eruption. Buccal Mucosa- Bilateral- No Aphthous ulcers. Oropharynx- No Discharge or Erythema. Tonsils: Characteristics- Bilateral- No Erythema or Congestion. Size/Enlargement- Bilateral- No enlargement. Discharge- bilateral-None.  Neck Neck- Supple. No Masses.   Chest and Lung  Exam Auscultation: Breath Sounds:-Even unlabored, mild shallow and mild faint upper lobe rhonchi.  Cardiovascular Auscultation:Rythm- Regular, rate and rhythm. Murmurs & Other Heart Sounds:Ausculatation of the heart reveal- No Murmurs.  Lymphatic Head & Neck General Head & Neck Lymphatics: Bilateral: Description- No Localized lymphadenopathy.         Assessment & Plan:

## 2014-04-01 NOTE — Progress Notes (Signed)
Pre visit review using our clinic review tool, if applicable. No additional management support is needed unless otherwise documented below in the visit note. 

## 2014-04-01 NOTE — Patient Instructions (Addendum)
You likely have bronchitis and by your heavy smoking history you may have early copd.  I am prescrbing tussionex for cough, Azithromycin antibiotic, 2 inhalers, and prednisone.  I do want to get a cxr today.  For your smoking cessation attempt. I will make wellbutrin available  Follow up in 10-14 days or as needed.

## 2014-04-08 ENCOUNTER — Encounter: Payer: Self-pay | Admitting: Medical

## 2014-04-08 ENCOUNTER — Ambulatory Visit (INDEPENDENT_AMBULATORY_CARE_PROVIDER_SITE_OTHER): Payer: Self-pay | Admitting: Medical

## 2014-04-08 VITALS — BP 147/88 | HR 98 | Temp 98.5°F | Ht 70.0 in | Wt 202.0 lb

## 2014-04-08 DIAGNOSIS — J189 Pneumonia, unspecified organism: Secondary | ICD-10-CM

## 2014-04-08 MED ORDER — LEVOFLOXACIN 500 MG PO TABS: 500.0000 mg | ORAL_TABLET | Freq: Every day | ORAL | Status: DC

## 2014-04-08 MED ORDER — CEFTRIAXONE SODIUM 1 G IJ SOLR
1.0000 g | Freq: Once | INTRAMUSCULAR | Status: AC
  Administered 2014-04-08: 1 g via INTRAMUSCULAR

## 2014-04-08 NOTE — Progress Notes (Signed)
Subjective:    Patient ID: Jonathan Powell, male    DOB: 06/18/1965, 48 y.o.   MRN: [id number]  HPI   Pt by cxr has possible lt side infiltrate. Pt felt better azithromycin. Pt was not coughing and he still has tussionex. After he finished antibiotic he is feeling worse again. Still fatigued. Pt has quite smoking. Pt is on qvar. He did take the prednisone.   Pt feels some subjective fever and flushed since yesterday. Some productive cough. Some sweating at night.  Past Medical History  Diagnosis Date  . History of chicken pox   . History of kidney stones     History   Social History  . Marital Status: Legally Separated    Spouse Name: N/A    Number of Children: 3  . Years of Education: N/A   Occupational History  . VP SALES    Social History Main Topics  . Smoking status: Former Smoker -- 2.00 packs/day    Types: Cigarettes    Quit date: 07/07/2011  . Smokeless tobacco: Never Used     Comment: using eCig 12/07/11  . Alcohol Use: Yes     Comment: rarely  . Drug Use: No  . Sexual Activity: Not on file   Other Topics Concern  . Not on file   Social History Narrative   Occupation: vice Agricultural engineer, security company   Current Smoker-  2 PPD.   Alcohol use-yes,  very rare 1-2 drinks   Regular exercise-yes   Smoking Status:  current   Packs/Day:  2.0   Caffeine use/day:  3-4 daily   Does Patient Exercise:  yes    Past Surgical History  Procedure Laterality Date  . Foot surgery  2001    right foot surgery  . Cholecystectomy  05/2010    Dr Ninfa Linden    Family History  Problem Relation Age of Onset  . Hyperlipidemia Mother   . Arthritis Maternal Grandmother   . Kidney failure Paternal Grandfather   . Alcohol abuse Paternal Grandfather     deceased  . ADD / ADHD Son     adopted    No Known Allergies  Current Outpatient Prescriptions on File Prior to Visit  Medication Sig Dispense Refill  . albuterol (PROVENTIL HFA;VENTOLIN HFA) 108 (90 BASE)  MCG/ACT inhaler Inhale 2 puffs into the lungs every 6 (six) hours as needed for wheezing or shortness of breath. 1 Inhaler 0  . beclomethasone (QVAR) 40 MCG/ACT inhaler Inhale 2 puffs into the lungs 2 (two) times daily. 1 Inhaler 1  . buPROPion (WELLBUTRIN SR) 150 MG 12 hr tablet 1 tab po x 3 days then 1 tab po bid. On refill give 60 tabs 63 tablet 1  . chlorpheniramine-HYDROcodone (TUSSIONEX PENNKINETIC ER) 10-8 MG/5ML LQCR Take 5 mLs by mouth every 12 (twelve) hours as needed for cough. 115 mL 0  . IBUPROFEN PO Take 2 tablets by mouth every 6 (six) hours as needed (headache).     No current facility-administered medications on file prior to visit.    BP 147/88 mmHg  Pulse 98  Temp(Src) 98.5 F (36.9 C) (Oral)  Ht 5\' 10"  (1.778 m)  Wt 202 lb (91.627 kg)  BMI 28.98 kg/m2  SpO2 95%        Review of Systems  Constitutional: Positive for fever and fatigue. Negative for chills.       Flushed feeling since yesterday.  HENT: Negative for congestion, ear pain, postnasal drip, sinus pressure, sore throat  and voice change.   Respiratory: Positive for cough. Negative for wheezing.        Productive  Cardiovascular: Negative for chest pain and palpitations.  Musculoskeletal: Negative for back pain and neck pain.  Neurological: Negative for dizziness and headaches.  Hematological: Negative for adenopathy. Does not bruise/bleed easily.       Objective:   Physical Exam   General  Mental Status - Alert. General Appearance - Well groomed. Not in acute distress. Mild fatigued appearance.  Skin Rashes- No Rashes.  HEENT Head- Normal. Ear Auditory Canal - Left- Normal. Right - Normal.Tympanic Membrane- Left- Normal. Right- Normal. Eye Sclera/Conjunctiva- Left- Normal. Right- Normal. Nose & Sinuses Nasal Mucosa- Left-  Not oggy or Congested. Right-  Not  boggy or Congested. Mouth & Throat Lips: Upper Lip- Normal: no dryness, cracking, pallor, cyanosis, or vesicular eruption. Lower  Lip-Normal: no dryness, cracking, pallor, cyanosis or vesicular eruption. Buccal Mucosa- Bilateral- No Aphthous ulcers. Oropharynx- No Discharge or Erythema. Tonsils: Characteristics- Bilateral- No Erythema or Congestion. Size/Enlargement- Bilateral- No enlargement. Discharge- bilateral-None.  Neck Neck- Supple. No Masses.   Chest and Lung Exam Auscultation: Breath Sounds:- even and unlabored, very faint  bilateral upper lobe rhonchi.  Cardiovascular Auscultation:Rythm- Regular, rate and rhythm. Murmurs & Other Heart Sounds:Ausculatation of the heart reveal- No Murmurs.  Lymphatic Head & Neck General Head & Neck Lymphatics: Bilateral: Description- No Localized lymphadenopathy.           Assessment & Plan:

## 2014-04-08 NOTE — Patient Instructions (Addendum)
You have pneumonia by cxr and clinically this appears to be the case as well. We gave you rocephin IM in the office today and prescription of levofloxin.   Continue the prior meds I rx'd on last visit.  I am putting a future cxr order to repeat in one week.  Follow up in 7 days if not significantly improved.

## 2014-04-08 NOTE — Assessment & Plan Note (Signed)
You have pneumonia by cxr and clinically this appears to be the case as well. We gave you rocephin IM in the office today and prescription of levofloxin.   Continue the prior meds I rx'd on last visit.  I am putting a future cxr order to repeat in one week.  Follow up in 7 days if not significantly improved.

## 2014-04-08 NOTE — Progress Notes (Signed)
Pre visit review using our clinic review tool, if applicable. No additional management support is needed unless otherwise documented below in the visit note. 

## 2014-04-13 ENCOUNTER — Ambulatory Visit (HOSPITAL_BASED_OUTPATIENT_CLINIC_OR_DEPARTMENT_OTHER)
Admission: RE | Admit: 2014-04-13 | Discharge: 2014-04-13 | Disposition: A | Discharge status: Home / Self Care | Payer: Self-pay | Source: Ambulatory Visit | Attending: Medical | Admitting: Medical

## 2014-04-13 ENCOUNTER — Telehealth: Payer: Self-pay | Admitting: *Deleted

## 2014-04-13 ENCOUNTER — Ambulatory Visit: Payer: Self-pay | Admitting: Medical

## 2014-04-13 DIAGNOSIS — J189 Pneumonia, unspecified organism: Secondary | ICD-10-CM | POA: Insufficient documentation

## 2014-04-13 NOTE — Telephone Encounter (Signed)
Pt did not show for appointment 04/13/2014 at 8:30am for Chest XR 12/4

## 2014-04-14 ENCOUNTER — Telehealth: Payer: Self-pay

## 2014-04-15 NOTE — Telephone Encounter (Signed)
Pt needs to come in for recheck. Will advise lpn to call pt.

## 2014-04-16 ENCOUNTER — Ambulatory Visit (INDEPENDENT_AMBULATORY_CARE_PROVIDER_SITE_OTHER): Payer: Self-pay | Admitting: Medical

## 2014-04-16 ENCOUNTER — Encounter: Payer: Self-pay | Admitting: Medical

## 2014-04-16 VITALS — BP 131/80 | HR 83 | Temp 98.8°F | Wt 197.0 lb

## 2014-04-16 DIAGNOSIS — R062 Wheezing: Secondary | ICD-10-CM

## 2014-04-16 DIAGNOSIS — J189 Pneumonia, unspecified organism: Secondary | ICD-10-CM

## 2014-04-16 MED ORDER — METHYLPREDNISOLONE ACETATE 40 MG/ML IJ SUSP
40.0000 mg | Freq: Once | INTRAMUSCULAR | Status: AC
  Administered 2014-04-16: 40 mg via INTRAMUSCULAR

## 2014-04-16 NOTE — Assessment & Plan Note (Signed)
Depo-medrol  40 mg im. Continue with qvar and albuterol.

## 2014-04-16 NOTE — Progress Notes (Signed)
Pre visit review using our clinic review tool, if applicable. No additional management support is needed unless otherwise documented below in the visit note. 

## 2014-04-16 NOTE — Progress Notes (Signed)
Subjective:    Patient ID: Jonathan Powell, male    DOB: 11-Sep-1965, 48 y.o.   MRN: [id number]  HPI   Pt is feeling better but not completely. Toward the end of the afternoon. He feels mild tired and flushed/warm. Pt states he feels about 80-85% better. The xray most recently did not show mid left lower lobe infiltrate but did read maybe interstitial lung disease.  Pt states he is wheezing a little bit very rarely during the day. Pt is using qvar. He has not used albuterol. Pt has taken levofloxin. Done with levofloxin since Tuesday. Residual cough. Pt is no longer smoking. He is on wellbutrin. Also on nicotine patch.  Past Medical History  Diagnosis Date  . History of chicken pox   . History of kidney stones     History   Social History  . Marital Status: Legally Separated    Spouse Name: N/A    Number of Children: 3  . Years of Education: N/A   Occupational History  . VP SALES    Social History Main Topics  . Smoking status: Former Smoker -- 2.00 packs/day    Types: Cigarettes    Quit date: 07/07/2011  . Smokeless tobacco: Never Used     Comment: using eCig 12/07/11  . Alcohol Use: Yes     Comment: rarely  . Drug Use: No  . Sexual Activity: Not on file   Other Topics Concern  . Not on file   Social History Narrative   Occupation: vice Agricultural engineer, security company   Current Smoker-  2 PPD.   Alcohol use-yes,  very rare 1-2 drinks   Regular exercise-yes   Smoking Status:  current   Packs/Day:  2.0   Caffeine use/day:  3-4 daily   Does Patient Exercise:  yes    Past Surgical History  Procedure Laterality Date  . Foot surgery  2001    right foot surgery  . Cholecystectomy  05/2010    Dr Ninfa Linden    Family History  Problem Relation Age of Onset  . Hyperlipidemia Mother   . Arthritis Maternal Grandmother   . Kidney failure Paternal Grandfather   . Alcohol abuse Paternal Grandfather     deceased  . ADD / ADHD Son     adopted    No Known  Allergies  Current Outpatient Prescriptions on File Prior to Visit  Medication Sig Dispense Refill  . albuterol (PROVENTIL HFA;VENTOLIN HFA) 108 (90 BASE) MCG/ACT inhaler Inhale 2 puffs into the lungs every 6 (six) hours as needed for wheezing or shortness of breath. 1 Inhaler 0  . beclomethasone (QVAR) 40 MCG/ACT inhaler Inhale 2 puffs into the lungs 2 (two) times daily. 1 Inhaler 1  . buPROPion (WELLBUTRIN SR) 150 MG 12 hr tablet 1 tab po x 3 days then 1 tab po bid. On refill give 60 tabs 63 tablet 1  . chlorpheniramine-HYDROcodone (TUSSIONEX PENNKINETIC ER) 10-8 MG/5ML LQCR Take 5 mLs by mouth every 12 (twelve) hours as needed for cough. 115 mL 0  . IBUPROFEN PO Take 2 tablets by mouth every 6 (six) hours as needed (headache).    Marland Kitchen levofloxacin (LEVAQUIN) 500 MG tablet Take 1 tablet (500 mg total) by mouth daily. (Patient not taking: Reported on 04/16/2014) 7 tablet 0   No current facility-administered medications on file prior to visit.    BP 131/80 mmHg  Pulse 83  Temp(Src) 98.8 F (37.1 C) (Oral)  Wt 197 lb (89.359 kg)  SpO2  98%      Review of Systems  Constitutional: Positive for fever, chills and fatigue.       Maybe mild subjective fever.  HENT: Negative for congestion, ear discharge, ear pain, facial swelling, nosebleeds, postnasal drip, sinus pressure, sore throat and trouble swallowing.   Respiratory: Positive for cough and wheezing. Negative for choking and shortness of breath.   Cardiovascular: Negative for chest pain and palpitations.  Gastrointestinal: Negative.   Musculoskeletal: Positive for back pain.  Neurological: Negative for dizziness, seizures, syncope, facial asymmetry, speech difficulty, light-headedness and headaches.  Hematological: Negative for adenopathy. Does not bruise/bleed easily.       Objective:   Physical Exam   General  Mental Status - Alert. General Appearance - Well groomed. Not in acute distress.  Skin Rashes- No  Rashes.  HEENT Head- Normal. Ear Auditory Canal - Left- Normal. Right - Normal.Tympanic Membrane- Left- Normal. Right- Normal. Eye Sclera/Conjunctiva- Left- Normal. Right- Normal. Nose & Sinuses Nasal Mucosa- Left-  Not oggy or Congested. Right-  Not  boggy or Congested. Mouth & Throat Lips: Upper Lip- Normal: no dryness, cracking, pallor, cyanosis, or vesicular eruption. Lower Lip-Normal: no dryness, cracking, pallor, cyanosis or vesicular eruption. Buccal Mucosa- Bilateral- No Aphthous ulcers. Oropharynx- No Discharge or Erythema. Tonsils: Characteristics- Bilateral- No Erythema or Congestion. Size/Enlargement- Bilateral- No enlargement. Discharge- bilateral-None.  Neck Neck- Supple. No Masses.   Chest and Lung Exam Auscultation: Breath Sounds:- even and unlabored.  Cardiovascular Auscultation:Rythm- Regular, rate and rhythm. Murmurs & Other Heart Sounds:Ausculatation of the heart reveal- No Murmurs.  Lymphatic Head & Neck General Head & Neck Lymphatics: Bilateral: Description- No Localized lymphadenopathy.        Assessment & Plan:

## 2014-04-16 NOTE — Assessment & Plan Note (Signed)
Your are 85% better approximate from the pneumonia.  I gave pt  rocephin 1 gram im today. I don't think will need further antibiotics.

## 2014-04-16 NOTE — Telephone Encounter (Signed)
Appointment scheduled for today 04/16/14 @ 4:15 pm.

## 2014-04-16 NOTE — Patient Instructions (Addendum)
Your are 85% better approximate from the pneumonia and some reactive airway disease from prior smoking. We gave you rocephin 1 gram im and depo-medrol  40 mg im. Continue with qvar and albuterol.  Follow up as needed for the above but I think you will feel back to normal/baseline  by one week.  Please schedule a complete wellness exam in 2 months and make early morning fasting.

## 2014-04-17 MED ORDER — CEFTRIAXONE SODIUM 1 G IJ SOLR
1.0000 g | Freq: Once | INTRAMUSCULAR | Status: AC
  Administered 2014-04-16: 1 g via INTRAMUSCULAR

## 2014-04-17 NOTE — Addendum Note (Signed)
Addended by: Harl Bowie on: 04/17/2014 08:12 AM   Modules accepted: Orders

## 2014-06-23 ENCOUNTER — Encounter: Payer: Self-pay | Admitting: Medical

## 2014-06-23 ENCOUNTER — Encounter: Payer: Self-pay | Admitting: Family

## 2014-06-23 NOTE — Progress Notes (Signed)
This encounter was created in error - please disregard.

## 2014-08-29 ENCOUNTER — Emergency Department (HOSPITAL_BASED_OUTPATIENT_CLINIC_OR_DEPARTMENT_OTHER): Payer: BLUE CROSS/BLUE SHIELD

## 2014-08-29 ENCOUNTER — Encounter (HOSPITAL_BASED_OUTPATIENT_CLINIC_OR_DEPARTMENT_OTHER): Payer: Self-pay | Admitting: Emergency Medicine

## 2014-08-29 ENCOUNTER — Emergency Department (HOSPITAL_BASED_OUTPATIENT_CLINIC_OR_DEPARTMENT_OTHER)
Admission: EM | Admit: 2014-08-29 | Discharge: 2014-08-29 | Disposition: A | Discharge status: Home / Self Care | Payer: BLUE CROSS/BLUE SHIELD | Attending: Emergency Medicine | Admitting: Emergency Medicine

## 2014-08-29 DIAGNOSIS — R05 Cough: Secondary | ICD-10-CM | POA: Diagnosis present

## 2014-08-29 DIAGNOSIS — Z8701 Personal history of pneumonia (recurrent): Secondary | ICD-10-CM | POA: Insufficient documentation

## 2014-08-29 DIAGNOSIS — J209 Acute bronchitis, unspecified: Secondary | ICD-10-CM | POA: Diagnosis not present

## 2014-08-29 DIAGNOSIS — Z87442 Personal history of urinary calculi: Secondary | ICD-10-CM | POA: Insufficient documentation

## 2014-08-29 DIAGNOSIS — Z8619 Personal history of other infectious and parasitic diseases: Secondary | ICD-10-CM | POA: Insufficient documentation

## 2014-08-29 DIAGNOSIS — Z720 Tobacco use: Secondary | ICD-10-CM | POA: Diagnosis not present

## 2014-08-29 DIAGNOSIS — Z7951 Long term (current) use of inhaled steroids: Secondary | ICD-10-CM | POA: Insufficient documentation

## 2014-08-29 DIAGNOSIS — Z79899 Other long term (current) drug therapy: Secondary | ICD-10-CM | POA: Diagnosis not present

## 2014-08-29 DIAGNOSIS — J40 Bronchitis, not specified as acute or chronic: Secondary | ICD-10-CM

## 2014-08-29 DIAGNOSIS — K219 Gastro-esophageal reflux disease without esophagitis: Secondary | ICD-10-CM | POA: Diagnosis not present

## 2014-08-29 HISTORY — DX: Pneumonia, unspecified organism: J18.9

## 2014-08-29 MED ORDER — ALBUTEROL SULFATE HFA 108 (90 BASE) MCG/ACT IN AERS: 1.0000 | INHALATION_SPRAY | Freq: Four times a day (QID) | RESPIRATORY_TRACT | Status: DC | PRN

## 2014-08-29 MED ORDER — PANTOPRAZOLE SODIUM 40 MG PO TBEC: 40.0000 mg | DELAYED_RELEASE_TABLET | Freq: Every day | ORAL | Status: DC

## 2014-08-29 MED ORDER — CETIRIZINE HCL 10 MG PO TABS: 10.0000 mg | ORAL_TABLET | Freq: Every day | ORAL | Status: DC

## 2014-08-29 MED ORDER — SUCRALFATE 1 GM/10ML PO SUSP: 1.0000 g | Freq: Three times a day (TID) | ORAL | Status: DC

## 2014-08-29 MED ORDER — GI COCKTAIL ~~LOC~~
30.0000 mL | Freq: Once | ORAL | Status: AC
  Administered 2014-08-29: 30 mL via ORAL
  Filled 2014-08-29: qty 30

## 2014-08-29 MED ORDER — DOXYCYCLINE HYCLATE 100 MG PO CAPS: 100.0000 mg | ORAL_CAPSULE | Freq: Two times a day (BID) | ORAL | Status: DC

## 2014-08-29 MED ORDER — OMEPRAZOLE 20 MG PO CPDR: 20.0000 mg | DELAYED_RELEASE_CAPSULE | Freq: Every day | ORAL | Status: DC

## 2014-08-29 NOTE — ED Notes (Signed)
C/o cough x 2 days  Worse this pm  Denies congestion

## 2014-08-29 NOTE — ED Notes (Signed)
Pt started coughing 2 days ago and then tonight felt like he couldn't get a deep breath, used albuterol inhaler with some improvement

## 2014-08-29 NOTE — ED Provider Notes (Signed)
CSN: [id number]     Arrival date & time 08/29/14  0138 History   First MD Initiated Contact with Patient 08/29/14 0150     Chief Complaint  Patient presents with  . Cough     (Consider location/radiation/quality/duration/timing/severity/associated sxs/prior Treatment) Patient is a 49 y.o. Powell presenting with cough. The history is provided by the patient.  Cough Cough characteristics:  Non-productive Severity:  Mild Onset quality:  Gradual Duration:  2 days Timing:  Sporadic Progression:  Unchanged Chronicity:  New Smoker: yes   Context: not sick contacts and not upper respiratory infection   Relieved by:  Nothing Worsened by:  Nothing tried Ineffective treatments:  None tried Associated symptoms: no chest pain, no chills, no fever, no shortness of breath and no wheezing   Associated symptoms comment:  Worse post prandial and lying flat worse tonight post hot wings Risk factors: no recent travel     Past Medical History  Diagnosis Date  . History of chicken pox   . History of kidney stones   . Pneumonia    Past Surgical History  Procedure Laterality Date  . Foot surgery  2001    right foot surgery  . Cholecystectomy  05/2010    Dr Ninfa Linden   Family History  Problem Relation Age of Onset  . Hyperlipidemia Mother   . Arthritis Maternal Grandmother   . Kidney failure Paternal Grandfather   . Alcohol abuse Paternal Grandfather     deceased  . ADD / ADHD Son     adopted   History  Substance Use Topics  . Smoking status: Current Every Day Smoker -- 1.00 packs/day    Types: Cigarettes    Last Attempt to Quit: 07/07/2011  . Smokeless tobacco: Never Used     Comment: using eCig 12/07/11  . Alcohol Use: Yes     Comment: rarely    Review of Systems  Constitutional: Negative for fever and chills.  HENT: Negative for congestion.   Respiratory: Positive for cough. Negative for shortness of breath and wheezing.   Cardiovascular: Negative for chest pain.  All other  systems reviewed and are negative.     Allergies  Review of patient's allergies indicates no known allergies.  Home Medications   Prior to Admission medications   Medication Sig Start Date End Date Taking? Authorizing Provider  albuterol (PROVENTIL HFA;VENTOLIN HFA) 108 (90 BASE) MCG/ACT inhaler Inhale 2 puffs into the lungs every 6 (six) hours as needed for wheezing or shortness of breath. 04/01/14  Yes Meriam Sprague Saguier, PA-C  beclomethasone (QVAR) 40 MCG/ACT inhaler Inhale 2 puffs into the lungs 2 (two) times daily. 04/01/14  Yes Meriam Sprague Saguier, PA-C   BP 121/85 mmHg  Pulse 102  Temp(Src) 99.5 F (37.5 C) (Oral)  Resp 18  Ht 5\' 11"  (1.803 m)  Wt 190 lb (86.183 kg)  BMI 26.51 kg/m2  SpO2 100% Physical Exam  Constitutional: He appears well-developed and well-nourished. No distress.  HENT:  Head: Normocephalic and atraumatic.  Mouth/Throat: Oropharynx is clear and moist.  Eyes: Conjunctivae are normal. Pupils are equal, round, and reactive to light.  Neck: Normal range of motion. Neck supple.  Cardiovascular: Normal rate, regular rhythm and intact distal pulses.   Pulmonary/Chest: Effort normal and breath sounds normal. No respiratory distress. He has no wheezes. He has no rales. He exhibits no tenderness.  Abdominal: Soft. Bowel sounds are increased. There is no tenderness. There is no rebound and no guarding.  Musculoskeletal: Normal range of motion.  Neurological: He is alert.  Skin: Skin is warm and dry.  Psychiatric: He has a normal mood and affect.    ED Course  Procedures (including critical care time) Labs Review Labs Reviewed - No data to display  Imaging Review Dg Chest 2 View  08/29/2014   CLINICAL DATA:  Acute onset of cough. Difficulty getting a deep breath. Initial encounter.  EXAM: CHEST  2 VIEW  COMPARISON:  Chest radiograph performed 04/13/2014  FINDINGS: The lungs are well-aerated. Peribronchial thickening is noted. Minimal bilateral opacities likely  reflect atelectasis, though pneumonia might have a similar appearance. There is no evidence of pleural effusion or pneumothorax.  The heart is normal in size; the mediastinal contour is within normal limits. No acute osseous abnormalities are seen.  IMPRESSION: Peribronchial thickening noted. Minimal bilateral opacities likely reflect atelectasis, though pneumonia might have a similar appearance.   Electronically Signed   By: Garald Balding M.D.   On: 08/29/2014 02:22     EKG Interpretation None      MDM   Final diagnoses:  None    Symptoms are not consistent with pneumonia.  There are no rales, no fever.  Symptoms are mainly lying flat in a patient with h/o GERD not taking meds.  Radiology states reading is more consistent with atelectasis.  Will treat for GERD.  Have refilled patient's inhaler as patient is a smoker.  Strict return precautions given.      Veatrice Kells, MD 08/29/14 [phone]

## 2014-10-14 ENCOUNTER — Other Ambulatory Visit: Payer: Self-pay | Admitting: Medical

## 2014-10-27 ENCOUNTER — Encounter: Payer: Self-pay | Admitting: Family

## 2014-10-27 ENCOUNTER — Telehealth: Payer: Self-pay | Admitting: Family

## 2014-10-27 ENCOUNTER — Ambulatory Visit (INDEPENDENT_AMBULATORY_CARE_PROVIDER_SITE_OTHER): Payer: BLUE CROSS/BLUE SHIELD | Admitting: Family

## 2014-10-27 VITALS — BP 112/80 | HR 72 | Temp 98.1°F | Resp 16 | Ht 70.0 in | Wt 196.0 lb

## 2014-10-27 DIAGNOSIS — R42 Dizziness and giddiness: Secondary | ICD-10-CM | POA: Diagnosis not present

## 2014-10-27 DIAGNOSIS — K219 Gastro-esophageal reflux disease without esophagitis: Secondary | ICD-10-CM

## 2014-10-27 LAB — BASIC METABOLIC PANEL
BUN: 14 mg/dL (ref 6–23)
CALCIUM: 9.3 mg/dL (ref 8.4–10.5)
CO2: 25 mEq/L (ref 19–32)
Chloride: 106 mEq/L (ref 96–112)
Creatinine, Ser: 0.9 mg/dL (ref 0.40–1.50)
GFR: 96.06 mL/min (ref >60.00)
GLUCOSE: 109 mg/dL — AB (ref 70–99)
POTASSIUM: 4.1 meq/L (ref 3.5–5.1)
SODIUM: 139 meq/L (ref 135–145)

## 2014-10-27 LAB — CBC WITH DIFFERENTIAL/PLATELET
BASOS PCT: 0.3 % (ref 0.0–3.0)
Basophils Absolute: 0 10*3/uL (ref 0.0–0.1)
EOS PCT: 0.2 % (ref 0.0–5.0)
Eosinophils Absolute: 0 10*3/uL (ref 0.0–0.7)
HCT: 44.1 % (ref 39.0–52.0)
Hemoglobin: 14.6 g/dL (ref 13.0–17.0)
Lymphocytes Relative: 16.3 % (ref 12.0–46.0)
Lymphs Abs: 1.5 10*3/uL (ref 0.7–4.0)
MCHC: 33.1 g/dL (ref 30.0–36.0)
MCV: 88.7 fl (ref 78.0–100.0)
MONO ABS: 0.4 10*3/uL (ref 0.1–1.0)
Monocytes Relative: 4.5 % (ref 3.0–12.0)
Neutro Abs: 7.1 10*3/uL (ref 1.4–7.7)
Neutrophils Relative %: 78.7 % — ABNORMAL HIGH (ref 43.0–77.0)
Platelets: 243 10*3/uL (ref 150.0–400.0)
RBC: 4.97 Mil/uL (ref 4.22–5.81)
RDW: 13.9 % (ref 11.5–15.5)
WBC: 9 10*3/uL (ref 4.0–10.5)

## 2014-10-27 LAB — TSH: TSH: 0.91 u[IU]/mL (ref 0.35–4.50)

## 2014-10-27 MED ORDER — PANTOPRAZOLE SODIUM 40 MG PO TBEC: 40.0000 mg | DELAYED_RELEASE_TABLET | Freq: Every day | ORAL | Status: DC

## 2014-10-27 NOTE — Progress Notes (Signed)
Pre visit review using our clinic review tool, if applicable. No additional management support is needed unless otherwise documented below in the visit note. 

## 2014-10-27 NOTE — Telephone Encounter (Signed)
Cancelled rx at [facility] on [address]. Sent rx to [facility] [place].  Pharmacy updated again. Notified pt.

## 2014-10-27 NOTE — Progress Notes (Signed)
Subjective:    Patient ID: Jonathan Powell, male    DOB: Feb 29, 1966, 49 y.o.   MRN: [id number]  HPI   Jonathan Powell is a 49 yr old male who presents today to discuss dizziness which has been intermittent x 24 hours. Developed ringing in the right ear yesterday. Reports associated tingling in both arms yesterday which resolved. Intermittent dizziness x 24 hours. Wonders if he may have been dehydrated. Drinks coffee and tea.  (8 cups of coffee in the AM and then lots of sweet tea). Has been working outside in the head on a [employer].   GERD- was given rx for protonix and was having cough from the GERD.  Took PPI.  Noted improvement in his gerd symptoms on protonix.  Ran out- symptoms worsened. Wants to resume protonix.     Review of Systems See HPI  Past Medical History  Diagnosis Date  . History of chicken pox   . History of kidney stones   . Pneumonia     History   Social History  . Marital Status: Legally Separated    Spouse Name: N/A  . Number of Children: 3  . Years of Education: N/A   Occupational History  . VP SALES    Social History Main Topics  . Smoking status: Current Every Day Smoker -- 1.00 packs/day    Types: Cigarettes    Last Attempt to Quit: 07/07/2011  . Smokeless tobacco: Never Used     Comment: using eCig 12/07/11  . Alcohol Use: Yes     Comment: rarely  . Drug Use: No  . Sexual Activity: Not on file   Other Topics Concern  . Not on file   Social History Narrative   Occupation: vice Agricultural engineer, security company   Current Smoker-  2 PPD.   Alcohol use-yes,  very rare 1-2 drinks   Regular exercise-yes   Smoking Status:  current   Packs/Day:  2.0   Caffeine use/day:  3-4 daily   Does Patient Exercise:  yes    Past Surgical History  Procedure Laterality Date  . Foot surgery  2001    right foot surgery  . Cholecystectomy  05/2010    Dr Ninfa Linden    Family History  Problem Relation Age of Onset  . Hyperlipidemia Mother   .  Arthritis Maternal Grandmother   . Kidney failure Paternal Grandfather   . Alcohol abuse Paternal Grandfather     deceased  . ADD / ADHD Son     adopted    No Known Allergies  Current Outpatient Prescriptions on File Prior to Visit  Medication Sig Dispense Refill  . pantoprazole (PROTONIX) 40 MG tablet Take 1 tablet (40 mg total) by mouth daily. (Patient not taking: Reported on 10/27/2014) 30 tablet 0  . [DISCONTINUED] omeprazole (PRILOSEC) 20 MG capsule Take 1 capsule (20 mg total) by mouth daily. 30 capsule 0   No current facility-administered medications on file prior to visit.    BP 112/80 mmHg  Pulse 72  Temp(Src) 98.1 F (36.7 C) (Oral)  Resp 16  Ht 5\' 10"  (1.778 m)  Wt 196 lb (88.905 kg)  BMI 28.12 kg/m2  SpO2 98%       Objective:   Physical Exam  Constitutional: He is oriented to person, place, and time. He appears well-developed and well-nourished. No distress.  HENT:  Head: Normocephalic and atraumatic.  Eyes: No scleral icterus.  Neck: No thyromegaly present.  Cardiovascular: Normal rate and regular rhythm.  No murmur heard. Pulmonary/Chest: Effort normal and breath sounds normal. No respiratory distress. He has no wheezes. He has no rales.  Musculoskeletal: He exhibits no edema.  Lymphadenopathy:    He has no cervical adenopathy.  Neurological: He is alert and oriented to person, place, and time.  Skin: Skin is warm and dry.  Psychiatric: He has a normal mood and affect. His behavior is normal. Thought content normal.          Assessment & Plan:

## 2014-10-27 NOTE — Patient Instructions (Signed)
Please complete lab work prior to leaving. Try to drink 6-8 glasses of water a day and cut back on caffeine. Schedule a complete physical at the front desk.

## 2014-10-27 NOTE — Telephone Encounter (Signed)
Pharmacy updated.

## 2014-10-27 NOTE — Telephone Encounter (Signed)
Please change pharmacy to [facility] in [place] for next time. Pt will p/u meds from today at [facility]

## 2014-10-27 NOTE — Assessment & Plan Note (Signed)
It sounds like pt was dehydrated. We discussed cutting back on his caffeine intake and quitting smoking. Increase water intake. Call if recurrent symptoms after these changes. Obtain tsh, cbc, bmet.

## 2014-10-27 NOTE — Telephone Encounter (Signed)
Patient states that he does not want this to be sent to [facility]. [facility] [place]. Patient requesting callback when sent.

## 2014-10-27 NOTE — Assessment & Plan Note (Signed)
Uncontrolled, resume PPI ?

## 2014-11-10 ENCOUNTER — Encounter: Payer: Self-pay | Admitting: Family

## 2014-11-17 ENCOUNTER — Telehealth: Payer: Self-pay | Admitting: Family

## 2014-11-17 NOTE — Telephone Encounter (Signed)
pre visit letter mailed 11/16/14

## 2014-12-03 ENCOUNTER — Telehealth: Payer: Self-pay | Admitting: *Deleted

## 2014-12-03 ENCOUNTER — Encounter: Payer: Self-pay | Admitting: *Deleted

## 2014-12-03 NOTE — Telephone Encounter (Signed)
Pre-Visit Call completed with patient and chart updated.   Pre-Visit Info documented in Specialty Comments under [employer].    

## 2014-12-07 ENCOUNTER — Ambulatory Visit (INDEPENDENT_AMBULATORY_CARE_PROVIDER_SITE_OTHER): Payer: BLUE CROSS/BLUE SHIELD | Admitting: Family

## 2014-12-07 ENCOUNTER — Encounter: Payer: Self-pay | Admitting: Family

## 2014-12-07 VITALS — BP 116/80 | HR 78 | Temp 97.7°F | Resp 16 | Ht 70.0 in | Wt 197.2 lb

## 2014-12-07 DIAGNOSIS — Z23 Encounter for immunization: Secondary | ICD-10-CM

## 2014-12-07 DIAGNOSIS — E785 Hyperlipidemia, unspecified: Secondary | ICD-10-CM

## 2014-12-07 DIAGNOSIS — Z0000 Encounter for general adult medical examination without abnormal findings: Secondary | ICD-10-CM

## 2014-12-07 LAB — HEPATIC FUNCTION PANEL
ALT: 45 U/L (ref 0–53)
AST: 21 U/L (ref 0–37)
Albumin: 4.1 g/dL (ref 3.5–5.2)
Alkaline Phosphatase: 112 U/L (ref 39–117)
Bilirubin, Direct: 0.1 mg/dL (ref 0.0–0.3)
Total Bilirubin: 0.3 mg/dL (ref 0.2–1.2)
Total Protein: 6.9 g/dL (ref 6.0–8.3)

## 2014-12-07 LAB — LIPID PANEL
Cholesterol: 225 mg/dL — ABNORMAL HIGH (ref 0–200)
HDL: 37 mg/dL — ABNORMAL LOW (ref >39.00)
LDL Cholesterol: 164 mg/dL — ABNORMAL HIGH (ref 0–99)
NonHDL: 188.41
Total CHOL/HDL Ratio: 6
Triglycerides: 124 mg/dL (ref 0.0–149.0)
VLDL: 24.8 mg/dL (ref 0.0–40.0)

## 2014-12-07 LAB — URINALYSIS, ROUTINE W REFLEX MICROSCOPIC
Bilirubin Urine: NEGATIVE
Hgb urine dipstick: NEGATIVE
Ketones, ur: NEGATIVE
Leukocytes, UA: NEGATIVE
Nitrite: NEGATIVE
RBC / HPF: NONE SEEN (ref 0–?)
Specific Gravity, Urine: <=1.005 — AB (ref 1.000–1.030)
Total Protein, Urine: NEGATIVE
Urine Glucose: NEGATIVE
Urobilinogen, UA: 0.2 (ref 0.0–1.0)
WBC, UA: NONE SEEN (ref 0–?)
pH: 6 (ref 5.0–8.0)

## 2014-12-07 LAB — HEMOGLOBIN A1C: Hgb A1c MFr Bld: 5.9 % (ref 4.6–6.5)

## 2014-12-07 NOTE — Patient Instructions (Addendum)
Continue to work on [employer], exercise, weight loss and quitting smoking. Complete lab work prior to leaving.  Follow up in 1 year for annual physical. Sooner if problems/concerns.

## 2014-12-07 NOTE — Progress Notes (Signed)
Pre visit review using our clinic review tool, if applicable. No additional management support is needed unless otherwise documented below in the visit note. 

## 2014-12-07 NOTE — Assessment & Plan Note (Signed)
Immunizations reviewed- Tdap given today. Continue healthy diet, exercise.  Obtain routine labs. Discussed modest weight loss, goal BM 25 or les.

## 2014-12-07 NOTE — Progress Notes (Signed)
Subjective:    Patient ID: Jonathan Powell, male    DOB: [birth year], 49 y.o.   MRN: [id number]  HPI  [name] is a 49 yr old male who presents today for cpx.  Patient presents today for complete physical.  Immunizations: due for tetanus Diet: reports healthy diet Exercise: some walking with golf Eye exam- up to date Dental:  Up to date Tobacco abuse: not yet motivated to quit   Review of Systems  Constitutional: Negative for unexpected weight change.  HENT: Negative for hearing loss and rhinorrhea.   Eyes: Negative for visual disturbance.  Respiratory: Negative for cough.   Cardiovascular: Negative for chest pain and leg swelling.  Gastrointestinal: Negative for nausea, constipation and blood in stool.       Uses protonix daily  Genitourinary: Negative for dysuria and frequency.  Musculoskeletal: Negative for myalgias and arthralgias.  Skin: Negative for rash.  Neurological: Negative for headaches.  Hematological: Negative for adenopathy.  Psychiatric/Behavioral:       Denies depression/anxiety   Past Medical History  Diagnosis Date  . History of chicken pox   . History of kidney stones   . Pneumonia     History   Social History  . Marital Status: Legally Separated    Spouse Name: N/A  . Number of Children: 3  . Years of Education: N/A   Occupational History  . VP SALES    Social History Main Topics  . Smoking status: Current Every Day Smoker -- 1.00 packs/day    Types: Cigarettes    Last Attempt to Quit: 07/07/2011  . Smokeless tobacco: Never Used     Comment: using eCig 12/07/11  . Alcohol Use: Yes     Comment: rarely  . Drug Use: No  . Sexual Activity: Not on file   Other Topics Concern  . Not on file   Social History Narrative   Occupation: vice Agricultural engineer, security company   Current Smoker-  2 PPD.   Alcohol use-yes,  very rare 1-2 drinks   Regular exercise-yes   Smoking Status:  current   Packs/Day:  1   Caffeine use/day:  3-4  daily   Does Patient Exercise:  Yes   Married- one biological son, one adopted son, step son       Past Surgical History  Procedure Laterality Date  . Foot surgery  2001    right foot surgery  . Cholecystectomy  05/2010    Dr Ninfa Linden    Family History  Problem Relation Age of Onset  . Hyperlipidemia Mother   . Arthritis Maternal Grandmother   . Kidney failure Paternal Grandfather   . Alcohol abuse Paternal Grandfather     deceased  . ADD / ADHD Son     adopted    No Known Allergies  Current Outpatient Prescriptions on File Prior to Visit  Medication Sig Dispense Refill  . pantoprazole (PROTONIX) 40 MG tablet Take 1 tablet (40 mg total) by mouth daily. 30 tablet 5  . [DISCONTINUED] omeprazole (PRILOSEC) 20 MG capsule Take 1 capsule (20 mg total) by mouth daily. 30 capsule 0   No current facility-administered medications on file prior to visit.    BP 116/80 mmHg  Pulse 78  Temp(Src) 97.7 F (36.5 C) (Oral)  Resp 16  Ht 5\' 10"  (1.778 m)  Wt 197 lb 3.2 oz (89.449 kg)  BMI 28.30 kg/m2  SpO2 98%       Objective:   Physical Exam  Physical Exam  Constitutional: He is oriented to person, place, and time. He appears well-developed and well-nourished. No distress.  HENT:  Head: Normocephalic and atraumatic.  Right Ear: Tympanic membrane and ear canal normal.  Left Ear: Tympanic membrane and ear canal normal.  Mouth/Throat: Oropharynx is clear and moist.  Eyes: Pupils are equal, round, and reactive to light. No scleral icterus.  Neck: Normal range of motion. No thyromegaly present.  Cardiovascular: Normal rate and regular rhythm.   No murmur heard. Pulmonary/Chest: Effort normal and breath sounds normal. No respiratory distress. He has no wheezes. He has no rales. He exhibits no tenderness.  Abdominal: Soft. Bowel sounds are normal. He exhibits no distension and no mass. There is no tenderness. There is no rebound and no guarding.  Musculoskeletal: He exhibits no  edema.  Lymphadenopathy:    He has no cervical adenopathy.  Neurological: He is alert and oriented to person, place, and time. He has normal patellar reflexes. He exhibits normal muscle tone. Coordination normal.  Skin: Skin is warm and dry.  Psychiatric: He has a normal mood and affect. His behavior is normal. Judgment and thought content normal.          Assessment & Plan:          Assessment & Plan:

## 2014-12-09 ENCOUNTER — Encounter: Payer: Self-pay | Admitting: Family

## 2014-12-23 ENCOUNTER — Encounter: Payer: Self-pay | Admitting: Family

## 2015-02-04 ENCOUNTER — Ambulatory Visit (INDEPENDENT_AMBULATORY_CARE_PROVIDER_SITE_OTHER): Payer: BLUE CROSS/BLUE SHIELD | Admitting: Family Medicine

## 2015-02-04 ENCOUNTER — Encounter: Payer: Self-pay | Admitting: Family Medicine

## 2015-02-04 VITALS — BP 123/79 | HR 82 | Temp 98.7°F | Resp 18 | Ht 70.0 in | Wt 197.0 lb

## 2015-02-04 DIAGNOSIS — J0100 Acute maxillary sinusitis, unspecified: Secondary | ICD-10-CM

## 2015-02-04 DIAGNOSIS — J019 Acute sinusitis, unspecified: Secondary | ICD-10-CM | POA: Insufficient documentation

## 2015-02-04 MED ORDER — BENZONATATE 200 MG PO CAPS: 200.0000 mg | ORAL_CAPSULE | Freq: Two times a day (BID) | ORAL | Status: DC | PRN

## 2015-02-04 MED ORDER — AMOXICILLIN-POT CLAVULANATE 875-125 MG PO TABS: 1.0000 | ORAL_TABLET | Freq: Two times a day (BID) | ORAL | Status: DC

## 2015-02-04 NOTE — Progress Notes (Signed)
   Subjective:    Patient ID: Jonathan Powell, male    DOB: March 14, 1966, 49 y.o.   MRN: [id number]  HPI  Congestion: Patient presents with a 12 day history of cough, congestion, headache, sinus pressure, mild sore throat and feeling feverish. He states the fever is somewhat new and has only been the last few days. Patient also endorses mild diarrhea. Patient denies rash, abdominal pain, nausea, vomiting, eye pain or ear pain. Patient has not been exposed to sick contacts. He does smoke daily. He has tried Mucinex last 2 days, but does not feel has improved his symptoms.  Everyday smoker - declined counseling No Known Allergies Past Medical History  Diagnosis Date  . History of chicken pox   . History of kidney stones   . Pneumonia     Review of Systems Negative, with the exception of above mentioned in HPI     Objective:   Physical Exam BP 123/79 mmHg  Pulse 82  Temp(Src) 98.7 F (37.1 C) (Temporal)  Resp 18  Ht 5\' 10"  (1.778 m)  Wt 197 lb (89.359 kg)  BMI 28.27 kg/m2  SpO2 95% Gen: Afebrile. No acute distress. Nontoxic in appearance, well-developed, well-nourished, Caucasian male. Pleasant. HENT: AT. [place]. Bilateral TM visualized without erythema, bulging or drainage. Full Shiny tympanic membranes.. MMM. Bilateral nares with erythema, no swelling. Throat without erythema or exudates. Cobblestoning present, mild cough on exam present. No hoarseness. Eyes:Pupils Equal Round Reactive to light, Extraocular movements intact,  Conjunctiva without redness, discharge or icterus. Neck/lymp/endocrine: Supple, mild right cervical lymphadenopathy CV: RRR  Chest: CTAB, no wheeze or crackles Skin: No rashes, purpura or petechiae.      Assessment & Plan:  1. Acute maxillary sinusitis, recurrence not specified - Signs and symptoms of maxillary sinusitis, rest, hydrate, Advil for headache or fever, continue Flonase and Mucinex. Augmentin twice a day 10 days. - Tessalon Perles prescribed  for cough, patient counseled on the possibility of cough lasting a few weeks past resolution of sinusitis. - benzonatate (TESSALON) 200 MG capsule; Take 1 capsule (200 mg total) by mouth 2 (two) times daily as needed for cough.  Dispense: 20 capsule; Refill: 0 - amoxicillin-clavulanate (AUGMENTIN) 875-125 MG tablet; Take 1 tablet by mouth 2 (two) times daily.  Dispense: 20 tablet; Refill: 0 - Follow-up as needed

## 2015-02-04 NOTE — Patient Instructions (Signed)
Rest, hydrate, continue flonase/mucinex, advil for fever/headache.  - Antibiotics for 10 days, two times a day.  - Tessalon Perles for cough two times a day as needed.   Sinusitis Sinusitis is redness, soreness, and inflammation of the paranasal sinuses. Paranasal sinuses are air pockets within the bones of your face (beneath the eyes, the middle of the forehead, or above the eyes). In healthy paranasal sinuses, mucus is able to drain out, and air is able to circulate through them by way of your nose. However, when your paranasal sinuses are inflamed, mucus and air can become trapped. This can allow bacteria and other germs to grow and cause infection. Sinusitis can develop quickly and last only a short time (acute) or continue over a long period (chronic). Sinusitis that lasts for more than 12 weeks is considered chronic.  CAUSES  Causes of sinusitis include:  Allergies.  Structural abnormalities, such as displacement of the cartilage that separates your nostrils (deviated septum), which can decrease the air flow through your nose and sinuses and affect sinus drainage.  Functional abnormalities, such as when the small hairs (cilia) that line your sinuses and help remove mucus do not work properly or are not present. SIGNS AND SYMPTOMS  Symptoms of acute and chronic sinusitis are the same. The primary symptoms are pain and pressure around the affected sinuses. Other symptoms include:  Upper toothache.  Earache.  Headache.  Bad breath.  Decreased sense of smell and taste.  A cough, which worsens when you are lying flat.  Fatigue.  Fever.  Thick drainage from your nose, which often is green and may contain pus (purulent).  Swelling and warmth over the affected sinuses. DIAGNOSIS  Your health care provider will perform a physical exam. During the exam, your health care provider may:  Look in your nose for signs of abnormal growths in your nostrils (nasal polyps).  Tap over the  affected sinus to check for signs of infection.  View the inside of your sinuses (endoscopy) using an imaging device that has a light attached (endoscope). If your health care provider suspects that you have chronic sinusitis, one or more of the following tests may be recommended:  Allergy tests.  Nasal culture. A sample of mucus is taken from your nose, sent to a lab, and screened for bacteria.  Nasal cytology. A sample of mucus is taken from your nose and examined by your health care provider to determine if your sinusitis is related to an allergy. TREATMENT  Most cases of acute sinusitis are related to a viral infection and will resolve on their own within 10 days. Sometimes medicines are prescribed to help relieve symptoms (pain medicine, decongestants, nasal steroid sprays, or saline sprays).  However, for sinusitis related to a bacterial infection, your health care provider will prescribe antibiotic medicines. These are medicines that will help kill the bacteria causing the infection.  Rarely, sinusitis is caused by a fungal infection. In theses cases, your health care provider will prescribe antifungal medicine. For some cases of chronic sinusitis, surgery is needed. Generally, these are cases in which sinusitis recurs more than 3 times per year, despite other treatments. HOME CARE INSTRUCTIONS   Drink plenty of water. Water helps thin the mucus so your sinuses can drain more easily.  Use a humidifier.  Inhale steam 3 to 4 times a day (for example, sit in the bathroom with the shower running).  Apply a warm, moist washcloth to your face 3 to 4 times a day, or  as directed by your health care provider.  Use saline nasal sprays to help moisten and clean your sinuses.  Take medicines only as directed by your health care provider.  If you were prescribed either an antibiotic or antifungal medicine, finish it all even if you start to feel better. SEEK IMMEDIATE MEDICAL CARE IF:  You  have increasing pain or severe headaches.  You have nausea, vomiting, or drowsiness.  You have swelling around your face.  You have vision problems.  You have a stiff neck.  You have difficulty breathing. MAKE SURE YOU:   Understand these instructions.  Will watch your condition.  Will get help right away if you are not doing well or get worse. Document Released: 04/24/2005 Document Revised: 09/08/2013 Document Reviewed: 05/09/2011 [facility] Patient Information 2015 [place], [place]. This information is not intended to replace advice given to you by your health care provider. Make sure you discuss any questions you have with your health care provider.

## 2015-02-04 NOTE — Progress Notes (Signed)
Pre visit review using our clinic review tool, if applicable. No additional management support is needed unless otherwise documented below in the visit note. 

## 2015-02-18 ENCOUNTER — Ambulatory Visit (INDEPENDENT_AMBULATORY_CARE_PROVIDER_SITE_OTHER): Payer: BLUE CROSS/BLUE SHIELD | Admitting: Physician Assistant

## 2015-02-18 VITALS — BP 120/76 | HR 95 | Temp 98.4°F | Resp 16 | Ht 70.5 in | Wt 199.0 lb

## 2015-02-18 DIAGNOSIS — H6981 Other specified disorders of Eustachian tube, right ear: Secondary | ICD-10-CM

## 2015-02-18 DIAGNOSIS — R059 Cough, unspecified: Secondary | ICD-10-CM

## 2015-02-18 DIAGNOSIS — R05 Cough: Secondary | ICD-10-CM | POA: Diagnosis not present

## 2015-02-18 MED ORDER — LORATADINE-PSEUDOEPHEDRINE ER 10-240 MG PO TB24: 1.0000 | ORAL_TABLET | Freq: Every day | ORAL | Status: DC

## 2015-02-18 MED ORDER — HYDROCODONE-HOMATROPINE 5-1.5 MG/5ML PO SYRP: 5.0000 mL | ORAL_SOLUTION | Freq: Three times a day (TID) | ORAL | Status: DC | PRN

## 2015-02-18 NOTE — Progress Notes (Signed)
02/18/2015 at 1:58 PM  Jonathan Powell / DOB: 1965/07/01 / MRN: [id number]  The patient has ANXIETY DEPRESSION; TOBACCO ABUSE; Elevated LFTs; Skin lesion of face; Dizziness; Chest pain at rest; GERD (gastroesophageal reflux disease); Preventative health care; and Acute sinusitis on his problem list.  SUBJECTIVE  Jonathan Powell is a 49 y.o. male who complains of right sided ear pain that he describes as a pressure that started two days ago.  He was just treated for a "sinus infection" with ten days of amox-clav bid which he finished.  Associates left sided tinnitus and mild decrease in hearing. He also complains of waxing and waning cough that seems to be improving, however states he is waking up at night from the cough.   He  has a past medical history of History of chicken pox; History of kidney stones; and Pneumonia.    Medications reviewed and updated by myself where necessary, and exist elsewhere in the encounter.   Jonathan Powell has No Known Allergies. He  reports that he has been smoking Cigarettes.  He has been smoking about 1.00 pack per day. He has never used smokeless tobacco. He reports that he drinks alcohol. He reports that he does not use illicit drugs. He  has no sexual activity history on file. The patient  has past surgical history that includes Foot surgery (2001) and Cholecystectomy (05/2010).  His family history includes ADD / ADHD in his son; Alcohol abuse in his paternal grandfather; Arthritis in his maternal grandmother; Hyperlipidemia in his mother; Kidney failure in his paternal grandfather.  Review of Systems  Constitutional: Negative for fever and chills.  Respiratory: Negative for shortness of breath.   Cardiovascular: Negative for chest pain.  Gastrointestinal: Negative for nausea and abdominal pain.  Genitourinary: Negative.   Skin: Negative for rash.  Neurological: Negative for dizziness and headaches.    OBJECTIVE  His  height is 5' 10.5" (1.791 m)  and weight is 199 lb (90.266 kg). His oral temperature is 98.4 F (36.9 C). His blood pressure is 120/76 and his pulse is 106. His respiration is 16 and oxygen saturation is 96%.  The patient's body mass index is 28.14 kg/(m^2).  Physical Exam  Vitals reviewed. Constitutional: He is oriented to person, place, and time. He appears well-developed and well-nourished. No distress.  HENT:  Right Ear: Hearing, tympanic membrane, external ear and ear canal normal.  Left Ear: Hearing, tympanic membrane, external ear and ear canal normal.  Nose: Mucosal edema present. No sinus tenderness. Right sinus exhibits no maxillary sinus tenderness and no frontal sinus tenderness. Left sinus exhibits no maxillary sinus tenderness and no frontal sinus tenderness.  Mouth/Throat: Uvula is midline and mucous membranes are normal. Mucous membranes are not pale, not dry and not cyanotic. Posterior oropharyngeal erythema present. No oropharyngeal exudate, posterior oropharyngeal edema or tonsillar abscesses.  Cardiovascular: Regular rhythm.   Respiratory: Effort normal and breath sounds normal. He has no wheezes. He has no rales.  Musculoskeletal: Normal range of motion.  Lymphadenopathy:    He has no cervical adenopathy.  Neurological: He is alert and oriented to person, place, and time.  Skin: Skin is warm and dry. He is not diaphoretic.  Psychiatric: He has a normal mood and affect.    No results found for this or any previous visit (from the past 24 hour(s)).  ASSESSMENT & PLAN  Jonathan Powell was seen today for ear pain and tinnitus.  Diagnoses and all orders for this visit:  ETD (eustachian  tube dysfunction), right: His ears appear normal. Mucosal edema right greater than left.   -     loratadine-pseudoephedrine (CLARITIN-D 24 HOUR) 10-240 MG 24 hr tablet; Take 1 tablet by mouth daily.  Cough -     HYDROcodone-homatropine (HYCODAN) 5-1.5 MG/5ML syrup; Take 5 mLs by mouth every 8 (eight) hours as needed for  cough.    The patient was advised to call or come back to clinic if he does not see an improvement in symptoms, or worsens with the above plan.   Philis Fendt, MHS, PA-C [facility] 02/18/2015 1:58 PM

## 2015-02-22 ENCOUNTER — Telehealth: Payer: Self-pay | Admitting: *Deleted

## 2015-02-22 MED ORDER — PANTOPRAZOLE SODIUM 40 MG PO TBEC: 40.0000 mg | DELAYED_RELEASE_TABLET | Freq: Every day | ORAL | Status: DC

## 2015-02-22 NOTE — Telephone Encounter (Signed)
Received fax from [facility] requesting pantoprazole 40mg  Rx once a day; 90 day supply.  Refills sent.

## 2015-03-03 ENCOUNTER — Other Ambulatory Visit: Payer: Self-pay | Admitting: Physician Assistant

## 2015-03-04 NOTE — Telephone Encounter (Signed)
[name], do you want to give pt RFs to remain on this?

## 2015-03-11 ENCOUNTER — Ambulatory Visit (HOSPITAL_BASED_OUTPATIENT_CLINIC_OR_DEPARTMENT_OTHER)
Admission: RE | Admit: 2015-03-11 | Discharge: 2015-03-11 | Disposition: A | Discharge status: Home / Self Care | Payer: BLUE CROSS/BLUE SHIELD | Source: Ambulatory Visit | Attending: Family Medicine | Admitting: Family Medicine

## 2015-03-11 ENCOUNTER — Encounter: Payer: Self-pay | Admitting: Family Medicine

## 2015-03-11 ENCOUNTER — Ambulatory Visit (INDEPENDENT_AMBULATORY_CARE_PROVIDER_SITE_OTHER): Payer: BLUE CROSS/BLUE SHIELD | Admitting: Family Medicine

## 2015-03-11 VITALS — BP 120/74 | HR 96 | Temp 98.3°F | Wt 195.2 lb

## 2015-03-11 DIAGNOSIS — R05 Cough: Secondary | ICD-10-CM | POA: Diagnosis present

## 2015-03-11 DIAGNOSIS — K219 Gastro-esophageal reflux disease without esophagitis: Secondary | ICD-10-CM

## 2015-03-11 DIAGNOSIS — J208 Acute bronchitis due to other specified organisms: Secondary | ICD-10-CM

## 2015-03-11 DIAGNOSIS — R0602 Shortness of breath: Secondary | ICD-10-CM | POA: Diagnosis not present

## 2015-03-11 MED ORDER — PREDNISONE 10 MG PO TABS: ORAL_TABLET | ORAL | Status: DC

## 2015-03-11 MED ORDER — LEVOFLOXACIN 500 MG PO TABS: 500.0000 mg | ORAL_TABLET | Freq: Every day | ORAL | Status: DC

## 2015-03-11 MED ORDER — DEXLANSOPRAZOLE 60 MG PO CPDR: 60.0000 mg | DELAYED_RELEASE_CAPSULE | Freq: Every day | ORAL | Status: DC

## 2015-03-11 NOTE — Progress Notes (Signed)
Pre visit review using our clinic review tool, if applicable. No additional management support is needed unless otherwise documented below in the visit note. 

## 2015-03-11 NOTE — Patient Instructions (Signed)

## 2015-03-12 ENCOUNTER — Encounter: Payer: Self-pay | Admitting: Internal Medicine

## 2015-03-14 NOTE — Progress Notes (Signed)
  Subjective:     Jonathan Powell is a 49 y.o. male who presents for evaluation of sinus pain. Symptoms include: congestion, cough, facial pain, nasal congestion and sinus pressure. Onset of symptoms was 3 weeks ago. Symptoms have been gradually worsening since that time. Past history is significant for no history of pneumonia or bronchitis. Patient is a non-smoker.  Pt has had 2 abx already.   The following portions of the patient's history were reviewed and updated as appropriate: He  has a past medical history of History of chicken pox; History of kidney stones; and Pneumonia..  Review of Systems Pertinent items are noted in HPI.   Objective:    BP 120/74 mmHg  Pulse 96  Temp(Src) 98.3 F (36.8 C) (Oral)  Wt 195 lb 3.2 oz (88.542 kg)  SpO2 96% General appearance: alert, cooperative, appears stated age and no distress Ears: normal TM's and external ear canals both ears Nose: green discharge, moderate congestion, turbinates red, swollen, sinus tenderness bilateral Throat: pnd Neck: moderate anterior cervical adenopathy, supple, symmetrical, trachea midline and thyroid not enlarged, symmetric, no tenderness/mass/nodules Lungs: clear to auscultation bilaterally Heart: S1, S2 normal    Assessment:    Acute bacterial sinusitis.    Plan:    Nasal steroids per medication orders. Antihistamines per medication orders. levaquin per medication orders.

## 2015-05-05 ENCOUNTER — Other Ambulatory Visit: Payer: Self-pay | Admitting: Family

## 2015-05-13 ENCOUNTER — Encounter: Payer: Self-pay | Admitting: Physician Assistant

## 2015-05-13 ENCOUNTER — Ambulatory Visit: Payer: BLUE CROSS/BLUE SHIELD | Admitting: Internal Medicine

## 2015-05-13 ENCOUNTER — Ambulatory Visit (INDEPENDENT_AMBULATORY_CARE_PROVIDER_SITE_OTHER): Payer: BLUE CROSS/BLUE SHIELD | Admitting: Physician Assistant

## 2015-05-13 VITALS — BP 110/84 | HR 96 | Ht 69.0 in | Wt 203.0 lb

## 2015-05-13 DIAGNOSIS — K219 Gastro-esophageal reflux disease without esophagitis: Secondary | ICD-10-CM

## 2015-05-13 MED ORDER — PANTOPRAZOLE SODIUM 40 MG PO TBEC: 40.0000 mg | DELAYED_RELEASE_TABLET | Freq: Two times a day (BID) | ORAL | Status: DC

## 2015-05-13 NOTE — Progress Notes (Signed)
Patient ID: Jonathan Powell, male   DOB: February 18, 1966, 50 y.o.   MRN: [id number]   Subjective:    Patient ID: Jonathan Powell, male    DOB: 12-Jul-1965, 50 y.o.   MRN: [id number]  HPI  Brave  Is a pleasant 50 year old white male known to Dr. Ardis Hughs. He was last seen here in 2013 and at that time had undergone evaluation for elevated LFTs. He was found to have a fatty liver and also gallstones and underwent subsequent cholecystectomy.  He comes in today with complaints of acid reflux which she has been having trouble with over the past 1 year. He says over the past 3 to 4 months his symptoms have been worse. He has been on Protonix 40 mg every morning over the past 6 months she says helps some but is not controlling his symptoms. Says frequently by afternoon he is having heartburn and indigestion and most evenings are worse. He also frequently wakes up with indigestion at night and sometimes has ascitic fluid in his mouth. In the evenings she's having frequent gas belching indigestion and pressure. He has no complaints of dysphagia or odynophagia. No abdominal pain. He has been trying to eat healthier, has  eliminated caffeine and sodas. He does admit that he likes to snack at night.  Review of Systems Pertinent positive and negative review of systems were noted in the above HPI section.  All other review of systems was otherwise negative.  Outpatient Encounter Prescriptions as of 05/13/2015  Medication Sig  . [facility] LORATADINE-D 24 HOUR 10-240 MG 24 hr tablet TAKE 1 TABLET BY MOUTH EVERY DAY (Patient taking differently: TAKE 1 TABLET BY MOUTH AS NEEDED)  . pantoprazole (PROTONIX) 40 MG tablet Take 1 tablet (40 mg total) by mouth 2 (two) times daily.  . [DISCONTINUED] pantoprazole (PROTONIX) 40 MG tablet Take 1 tablet by mouth 2 (two) times daily.  . [DISCONTINUED] dexlansoprazole (DEXILANT) 60 MG capsule Take 1 capsule (60 mg total) by mouth daily.  . [DISCONTINUED] levofloxacin (LEVAQUIN) 500 MG  tablet Take 1 tablet (500 mg total) by mouth daily.  . [DISCONTINUED] predniSONE (DELTASONE) 10 MG tablet 3 po qd for 3 days then 2 po qd for 3 days the 1 po qd for 3 days   No facility-administered encounter medications on file as of 05/13/2015.   No Known Allergies Patient Active Problem List   Diagnosis Date Noted  . Acute sinusitis 02/04/2015  . Preventative health care 12/07/2014  . GERD (gastroesophageal reflux disease) 10/27/2014  . Chest pain at rest 06/18/2013  . Dizziness 12/07/2011  . Elevated LFTs 04/26/2011  . Skin lesion of face 04/26/2011  . ANXIETY DEPRESSION 11/24/2009  . TOBACCO ABUSE 11/24/2009   Social History   Social History  . Marital Status: Legally Separated    Spouse Name: N/A  . Number of Children: 2  . Years of Education: N/A   Occupational History  . VP SALES     self employed   Social History Main Topics  . Smoking status: Current Every Day Smoker -- 1.00 packs/day    Types: Cigarettes  . Smokeless tobacco: Never Used     Comment: using eCig 12/07/11  . Alcohol Use: 0.0 oz/week    0 Standard drinks or equivalent per week     Comment: rarely 2 per week  . Drug Use: No  . Sexual Activity: Not on file   Other Topics Concern  . Not on file   Social History Narrative   Occupation:  vice president Press photographer, security company   Current Smoker-  2 PPD.   Alcohol use-yes,  very rare 1-2 drinks   Regular exercise-yes   Smoking Status:  current   Packs/Day:  1   Caffeine use/day:  3-4 daily   Does Patient Exercise:  Yes   Married- one biological son, one adopted son, step son       Mr. Cheatom family history includes ADD / ADHD in his son; Alcohol abuse in his paternal grandfather; Arthritis in his maternal grandmother; Hyperlipidemia in his mother; Irritable bowel syndrome in his mother; Kidney failure in his paternal grandfather.      Objective:    Filed Vitals:   05/13/15 1435  BP: 110/84  Pulse: 96    Physical Exam   Well-developed  white male in no acute distress, pleasant blood pressure 110 over 84 pulse 96 height 5 foot 9 weight 203. HEENT nontraumatic normocephalic EOMI PERRLA sclera anicteric, not further examined today     Assessment & Plan:   #1 50 yo WM with one year hx of chronic daily reflux, worse past few months despite Protonix  qam Symptoms consistent with chronic GERD,r/o Barretts #2 s/p cholecytectomy #3 fatty liver  Plan;  Reviewed strict anti-reflux regimen and patient was given educational material. Advised nothing by mouth for 3 hours prior to bedtime and elevating head of the bed.  He is also a smoker which is not helping  We will increase Protonix to 40 mg by mouth twice a day before meals breakfast and before meals dinner   Schedule for EGD with Dr. Ardis Hughs. Procedure discussed in detail with the patient and he is agreeable to proceed.   Amy S Esterwood PA-C 05/13/2015   Cc: Rosalita Chessman, DO

## 2015-05-13 NOTE — Patient Instructions (Addendum)
We sent refills for the Pantoprazole sodium 40 mg, Take 1 tab before breakfast and 1 tab before dinner. We have given you an antireflux regimen.   You have been scheduled for an endoscopy. Please follow written instructions given to you at your visit today. If you use inhalers (even only as needed), please bring them with you on the day of your procedure. Your physician has requested that you go to www.startemmi.com and enter the access code given to you at your visit today. This web site gives a general overview about your procedure. However, you should still follow specific instructions given to you by our office regarding your preparation for the procedure.  We have put you on a wait list for an afternoon appointment before 07-02-2015.

## 2015-05-14 NOTE — Progress Notes (Signed)
i agree with the above note, plan 

## 2015-06-03 ENCOUNTER — Telehealth: Payer: Self-pay | Admitting: Medical

## 2015-06-03 ENCOUNTER — Encounter: Payer: Self-pay | Admitting: Medical

## 2015-06-03 ENCOUNTER — Ambulatory Visit (INDEPENDENT_AMBULATORY_CARE_PROVIDER_SITE_OTHER): Payer: BLUE CROSS/BLUE SHIELD | Admitting: Medical

## 2015-06-03 VITALS — BP 118/74 | HR 100 | Temp 98.1°F | Ht 69.0 in | Wt 201.0 lb

## 2015-06-03 DIAGNOSIS — R55 Syncope and collapse: Secondary | ICD-10-CM

## 2015-06-03 DIAGNOSIS — R5383 Other fatigue: Secondary | ICD-10-CM | POA: Diagnosis not present

## 2015-06-03 DIAGNOSIS — T43615D Adverse effect of caffeine, subsequent encounter: Secondary | ICD-10-CM | POA: Diagnosis not present

## 2015-06-03 DIAGNOSIS — R51 Headache: Secondary | ICD-10-CM

## 2015-06-03 DIAGNOSIS — R519 Headache, unspecified: Secondary | ICD-10-CM

## 2015-06-03 LAB — CBC WITH DIFFERENTIAL/PLATELET
BASOS ABS: 0 10*3/uL (ref 0.0–0.1)
Basophils Relative: 0.3 % (ref 0.0–3.0)
Eosinophils Absolute: 0.1 10*3/uL (ref 0.0–0.7)
Eosinophils Relative: 0.7 % (ref 0.0–5.0)
HCT: 45.3 % (ref 39.0–52.0)
HEMOGLOBIN: 15 g/dL (ref 13.0–17.0)
LYMPHS ABS: 2 10*3/uL (ref 0.7–4.0)
Lymphocytes Relative: 17.9 % (ref 12.0–46.0)
MCHC: 33.1 g/dL (ref 30.0–36.0)
MCV: 89 fl (ref 78.0–100.0)
MONO ABS: 0.5 10*3/uL (ref 0.1–1.0)
Monocytes Relative: 4.4 % (ref 3.0–12.0)
Neutro Abs: 8.6 10*3/uL — ABNORMAL HIGH (ref 1.4–7.7)
Neutrophils Relative %: 76.7 % (ref 43.0–77.0)
Platelets: 211 10*3/uL (ref 150.0–400.0)
RBC: 5.09 Mil/uL (ref 4.22–5.81)
RDW: 13.4 % (ref 11.5–15.5)
WBC: 11.2 10*3/uL — AB (ref 4.0–10.5)

## 2015-06-03 LAB — COMPREHENSIVE METABOLIC PANEL
ALBUMIN: 4.1 g/dL (ref 3.5–5.2)
ALK PHOS: 95 U/L (ref 39–117)
ALT: 67 U/L — AB (ref 0–53)
AST: 23 U/L (ref 0–37)
BUN: 15 mg/dL (ref 6–23)
CHLORIDE: 106 meq/L (ref 96–112)
CO2: 29 mEq/L (ref 19–32)
CREATININE: 0.86 mg/dL (ref 0.40–1.50)
Calcium: 9.3 mg/dL (ref 8.4–10.5)
GFR: 100.97 mL/min (ref >60.00)
Glucose, Bld: 138 mg/dL — ABNORMAL HIGH (ref 70–99)
Potassium: 4.2 mEq/L (ref 3.5–5.1)
SODIUM: 141 meq/L (ref 135–145)
TOTAL PROTEIN: 6.8 g/dL (ref 6.0–8.3)
Total Bilirubin: 0.3 mg/dL (ref 0.2–1.2)

## 2015-06-03 LAB — TSH: TSH: 0.89 u[IU]/mL (ref 0.35–4.50)

## 2015-06-03 MED ORDER — CYCLOBENZAPRINE HCL 5 MG PO TABS: ORAL_TABLET | ORAL | Status: DC

## 2015-06-03 NOTE — Telephone Encounter (Signed)
Will do!

## 2015-06-03 NOTE — Telephone Encounter (Signed)
Can you get pt ct scheduled by Tuesday of upcoming week.

## 2015-06-03 NOTE — Progress Notes (Signed)
Subjective:    Patient ID: Jonathan Powell, male    DOB: 11-Oct-1965, 50 y.o.   MRN: [id number]  HPI  Pt in states he was in [place] past Tuesday. He did not feel well. Pt states at that time he felt like he was about to pass out(No sob, no chest pain). Describes light headed sensation at that time  He was on business trip. He was evaluated. He thought he may have been anxious secondary to feeling poorly. By ED.  The day of near syncope  he had 6 cups of coffee and 5 hour energy drink. The night before he had 3-4 mixed drinks.   Pt has daily ha on and off for one year(intesitity moderate at times). But for 15 years history of mild intermittent ha  couple of days a week only . 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 He feeling some  stress. Pt feels like he is always working. 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  Over past year with ha. No dizziness. No gross motor or sensory function deficts reports. Last couple of days neck sore. But no stiffness  No chest pain.  Pt states with fluids In ED he felt better.   Some fatigue for a while even before this. He states overall bette but not back to normal.    Review of Systems  Constitutional: Negative for fever, chills and fatigue.  HENT: Positive for tinnitus. Negative for congestion, drooling, ear discharge, facial swelling, mouth sores, nosebleeds, postnasal drip, rhinorrhea and sinus pressure.        Some rt tinnitus mild for a year. He states has mentioned to providers before.  Respiratory: Negative for cough, chest tightness, shortness of breath and wheezing.   Cardiovascular: Negative for chest pain and palpitations.  Genitourinary: Negative for dysuria, frequency and flank pain.  Musculoskeletal: Negative for back pain.  Neurological: Negative for dizziness, syncope, speech difficulty, weakness, numbness and headaches.       None presently but near syncope past Tuesday.  Hematological: Negative for adenopathy. Does not bruise/bleed  easily.  Psychiatric/Behavioral: Negative for hallucinations, behavioral problems and confusion. The patient is not nervous/anxious.        Thinks and states some stress with work.   Past Medical History  Diagnosis Date  . History of chicken pox   . History of kidney stones   . Pneumonia     Social History   Social History  . Marital Status: Legally Separated    Spouse Name: N/A  . Number of Children: 2  . Years of Education: N/A   Occupational History  . VP SALES     self employed   Social History Main Topics  . Smoking status: Current Every Day Smoker -- 1.00 packs/day    Types: Cigarettes  . Smokeless tobacco: Never Used     Comment: using eCig 12/07/11  . Alcohol Use: 0.0 oz/week    0 Standard drinks or equivalent per week     Comment: rarely 2 per week  . Drug Use: No  . Sexual Activity: Not on file   Other Topics Concern  . Not on file   Social History Narrative   Occupation: vice Agricultural engineer, security company   Current Smoker-  2 PPD.   Alcohol use-yes,  very rare 1-2 drinks   Regular exercise-yes   Smoking Status:  current   Packs/Day:  1   Caffeine use/day:  3-4 daily   Does Patient Exercise:  Yes   Married- one  biological son, one adopted son, step son       Past Surgical History  Procedure Laterality Date  . Foot surgery Right 2001    toe surgery  . Cholecystectomy  05/2010    Dr Ninfa Linden    Family History  Problem Relation Age of Onset  . Hyperlipidemia Mother   . Arthritis Maternal Grandmother   . Kidney failure Paternal Grandfather   . Alcohol abuse Paternal Grandfather     deceased  . ADD / ADHD Son     adopted  . Irritable bowel syndrome Mother     No Known Allergies  Current Outpatient Prescriptions on File Prior to Visit  Medication Sig Dispense Refill  . [facility] LORATADINE-D 24 HOUR 10-240 MG 24 hr tablet TAKE 1 TABLET BY MOUTH EVERY DAY (Patient taking differently: TAKE 1 TABLET BY MOUTH AS NEEDED) 90 tablet 3  . pantoprazole  (PROTONIX) 40 MG tablet Take 1 tablet (40 mg total) by mouth 2 (two) times daily. 60 tablet 3  . [DISCONTINUED] omeprazole (PRILOSEC) 20 MG capsule Take 1 capsule (20 mg total) by mouth daily. 30 capsule 0   No current facility-administered medications on file prior to visit.    BP 118/74 mmHg  Pulse 100  Temp(Src) 98.1 F (36.7 C) (Oral)  Ht 5\' 9"  (1.753 m)  Wt 201 lb (91.173 kg)  BMI 29.67 kg/m2  SpO2 98%        Objective:   Physical Exam  General Mental Status- Alert. General Appearance- Not in acute distress.   Skin General: Color- Normal Color. Moisture- Normal Moisture.  Neck Carotid Arteries- Normal color. Moisture- Normal Moisture. No carotid bruits. No JVD. Trapezius tender to palpation. More rt side. No nuccal rigidity. No stiffness.   Chest and Lung Exam Auscultation: Breath Sounds:-Normal.  Cardiovascular Auscultation:Rythm- Regular. Murmurs & Other Heart Sounds:Auscultation of the heart reveals- No Murmurs.  Abdomen Inspection:-Inspeection Normal. Palpation/Percussion:Note:No mass. Palpation and Percussion of the abdomen reveal- Non Tender, Non Distended + BS, no rebound or guarding.    Neurologic Cranial Nerve exam:- CN III-XII intact(No nystagmus), symmetric smile. Drift Test:- No drift. Finger to Nose:- Normal/Intact Strength:- 5/5 equal and symmetric strength both upper and lower extremities.      Assessment & Plan:  For fatigue will get labs.   For recent near syncope and increase frequency of head aches will order CT of head without contrast.  Cut back on caffeine stay hydrated. But not to overhydrate.  You may have some tension ha features based on trapezius myaglia. Will give flexeril to use at night if very stressful day, neck muscle tight and ha.  Follow up in 10-14 days pcp or myself.  If any actual syncope(passing out) were to occur then ED evaluation.  If labs and ct of head negative but you have any further near syncope  events then consider cardiolgist evaluation.

## 2015-06-03 NOTE — Progress Notes (Signed)
Pre visit review using our clinic review tool, if applicable. No additional management support is needed unless otherwise documented below in the visit note. 

## 2015-06-03 NOTE — Patient Instructions (Addendum)
For fatigue will get labs.   For recent near syncope and increase frequency of head aches will order CT of head without contrast.  Cut back on caffeine stay hydrated. But not to overhydrate.  You may have some tension ha features based on trapezius myaglia. Will give flexeril to use at night if very stressful day, neck muscle tight and ha.  Follow up in 10-14 days pcp or myself.  If any actual syncope(passing out) were to occur then ED evaluation.  If labs and ct of head negative but you have any further near syncope events then consider cardiolgist evaluation.

## 2015-06-04 ENCOUNTER — Ambulatory Visit (HOSPITAL_BASED_OUTPATIENT_CLINIC_OR_DEPARTMENT_OTHER): Payer: BLUE CROSS/BLUE SHIELD

## 2015-06-04 ENCOUNTER — Telehealth: Payer: Self-pay | Admitting: Medical

## 2015-06-04 ENCOUNTER — Other Ambulatory Visit (INDEPENDENT_AMBULATORY_CARE_PROVIDER_SITE_OTHER): Payer: BLUE CROSS/BLUE SHIELD

## 2015-06-04 DIAGNOSIS — E119 Type 2 diabetes mellitus without complications: Secondary | ICD-10-CM

## 2015-06-04 LAB — HEMOGLOBIN A1C: Hgb A1c MFr Bld: 6.1 % (ref 4.6–6.5)

## 2015-06-04 MED ORDER — CEFDINIR 300 MG PO CAPS: 300.0000 mg | ORAL_CAPSULE | Freq: Two times a day (BID) | ORAL | Status: DC

## 2015-06-04 NOTE — Telephone Encounter (Signed)
rx sent in to his pharmacy. Waiting until feb ok on ct provided his symptoms don't worsen. If so would want to get ct sooner.

## 2015-06-04 NOTE — Telephone Encounter (Signed)
Patient informed prescription sent in

## 2015-06-07 ENCOUNTER — Telehealth: Payer: Self-pay

## 2015-06-07 NOTE — Telephone Encounter (Signed)
Spoke with pt and he states that he does not need the paperwork that was given to [employer] from last visit. He states that its ok to trash the paperwork

## 2015-06-15 ENCOUNTER — Encounter: Payer: Self-pay | Admitting: Family

## 2015-06-15 ENCOUNTER — Ambulatory Visit (INDEPENDENT_AMBULATORY_CARE_PROVIDER_SITE_OTHER): Payer: 59 | Admitting: Family

## 2015-06-15 VITALS — BP 130/86 | HR 99 | Temp 98.2°F | Resp 18 | Ht 69.0 in | Wt 198.0 lb

## 2015-06-15 DIAGNOSIS — R7989 Other specified abnormal findings of blood chemistry: Secondary | ICD-10-CM

## 2015-06-15 DIAGNOSIS — G44229 Chronic tension-type headache, not intractable: Secondary | ICD-10-CM

## 2015-06-15 DIAGNOSIS — D72829 Elevated white blood cell count, unspecified: Secondary | ICD-10-CM | POA: Diagnosis not present

## 2015-06-15 DIAGNOSIS — R945 Abnormal results of liver function studies: Secondary | ICD-10-CM

## 2015-06-15 LAB — CBC WITH DIFFERENTIAL/PLATELET
BASOS ABS: 0 10*3/uL (ref 0.0–0.1)
BASOS PCT: 0.3 % (ref 0.0–3.0)
EOS ABS: 0 10*3/uL (ref 0.0–0.7)
Eosinophils Relative: 0.5 % (ref 0.0–5.0)
HEMATOCRIT: 48.3 % (ref 39.0–52.0)
Hemoglobin: 16 g/dL (ref 13.0–17.0)
LYMPHS PCT: 23.7 % (ref 12.0–46.0)
Lymphs Abs: 2.2 10*3/uL (ref 0.7–4.0)
MCHC: 33 g/dL (ref 30.0–36.0)
MCV: 89.4 fl (ref 78.0–100.0)
MONO ABS: 0.6 10*3/uL (ref 0.1–1.0)
Monocytes Relative: 6.2 % (ref 3.0–12.0)
NEUTROS ABS: 6.3 10*3/uL (ref 1.4–7.7)
NEUTROS PCT: 69.3 % (ref 43.0–77.0)
PLATELETS: 245 10*3/uL (ref 150.0–400.0)
RBC: 5.4 Mil/uL (ref 4.22–5.81)
RDW: 13.3 % (ref 11.5–15.5)
WBC: 9.1 10*3/uL (ref 4.0–10.5)

## 2015-06-15 LAB — HEPATIC FUNCTION PANEL
ALK PHOS: 125 U/L — AB (ref 39–117)
ALT: 82 U/L — ABNORMAL HIGH (ref 0–53)
AST: 35 U/L (ref 0–37)
Albumin: 4.4 g/dL (ref 3.5–5.2)
BILIRUBIN DIRECT: 0.1 mg/dL (ref 0.0–0.3)
BILIRUBIN TOTAL: 0.4 mg/dL (ref 0.2–1.2)
TOTAL PROTEIN: 7.5 g/dL (ref 6.0–8.3)

## 2015-06-15 NOTE — Progress Notes (Signed)
Subjective:    Patient ID: Jonathan Powell, male    DOB: [birth year], 49 y.o.   MRN: [id number]  HPI  Jonathan Powell is a 50 yr old male who presents today for follow up.  He was seen by Mackie Pai PA-C on 06/03/15 following an episode of near syncope which was evaluated in a [place] ER 05/25/15.  Lab work showed borderline DM (A1C 6.1), mild leukocytosis (WBC 11.2). ALT was mildly elevated.    Pt reports that he developed dizziness, felt bad.  Grabbed a bottle of water. Reports that he felt better after he had fluids. He denies any further near syncopal or syncopal episodes. Denies CP or SOB.   Since he has been home his headaches are "a little less frequent."  Notes that he often has tension in his neck and shoulders which radiates into the back of his head. He wishes to avoid medication for HA.    Review of Systems See HPI  Past Medical History  Diagnosis Date  . History of chicken pox   . History of kidney stones   . Pneumonia     Social History   Social History  . Marital Status: Legally Separated    Spouse Name: N/A  . Number of Children: 2  . Years of Education: N/A   Occupational History  . VP SALES     self employed   Social History Main Topics  . Smoking status: Current Every Day Smoker -- 1.00 packs/day    Types: Cigarettes  . Smokeless tobacco: Never Used     Comment: using eCig 12/07/11  . Alcohol Use: 0.0 oz/week    0 Standard drinks or equivalent per week     Comment: rarely 2 per week  . Drug Use: No  . Sexual Activity: Not on file   Other Topics Concern  . Not on file   Social History Narrative   Occupation: vice Agricultural engineer, security company   Current Smoker-  2 PPD.   Alcohol use-yes,  very rare 1-2 drinks   Regular exercise-yes   Smoking Status:  current   Packs/Day:  1   Caffeine use/day:  3-4 daily   Does Patient Exercise:  Yes   Married- one biological son, one adopted son, step son       Past Surgical History  Procedure  Laterality Date  . Foot surgery Right 2001    toe surgery  . Cholecystectomy  05/2010    Dr Ninfa Linden    Family History  Problem Relation Age of Onset  . Hyperlipidemia Mother   . Arthritis Maternal Grandmother   . Kidney failure Paternal Grandfather   . Alcohol abuse Paternal Grandfather     deceased  . ADD / ADHD Son     adopted  . Irritable bowel syndrome Mother     No Known Allergies  Current Outpatient Prescriptions on File Prior to Visit  Medication Sig Dispense Refill  . [facility] LORATADINE-D 24 HOUR 10-240 MG 24 hr tablet TAKE 1 TABLET BY MOUTH EVERY DAY (Patient taking differently: TAKE 1 TABLET BY MOUTH AS NEEDED) 90 tablet 3  . pantoprazole (PROTONIX) 40 MG tablet Take 1 tablet (40 mg total) by mouth 2 (two) times daily. 60 tablet 3  . cyclobenzaprine (FLEXERIL) 5 MG tablet 1 tab po q hs if tension type ha occuring (Patient not taking: Reported on 06/15/2015) 7 tablet 0  . [DISCONTINUED] omeprazole (PRILOSEC) 20 MG capsule Take 1 capsule (20 mg total) by mouth daily.  30 capsule 0   No current facility-administered medications on file prior to visit.    BP 130/86 mmHg  Pulse 99  Temp(Src) 98.2 F (36.8 C) (Oral)  Resp 18  Ht 5\' 9"  (1.753 m)  Wt 198 lb (89.812 kg)  BMI 29.23 kg/m2  SpO2 98%       Objective:   Physical Exam  Constitutional: He is oriented to person, place, and time. He appears well-developed and well-nourished. No distress.  HENT:  Head: Normocephalic and atraumatic.  Cardiovascular: Normal rate and regular rhythm.   No murmur heard. Pulmonary/Chest: Effort normal and breath sounds normal. No respiratory distress. He has no wheezes. He has no rales.  Musculoskeletal: He exhibits no edema.  Lymphadenopathy:    He has no cervical adenopathy.  Neurological: He is alert and oriented to person, place, and time.  Skin: Skin is warm and dry.  Psychiatric: He has a normal mood and affect. His behavior is normal. Thought content normal.            Assessment & Plan:  Tension Headaches- we discussed adding massage therapy to help with tension headaches. He wishes to avoid medication. He is scheduled for a CT of the head on Friday for further evaluation.   Abnormal LFT- repeat LFT today.  Leukocytosis- mild, last visit.  Repeat cbc today.

## 2015-06-15 NOTE — Patient Instructions (Addendum)
Please complete lab work prior to leaving. Make sure to drink plenty of water. Consider adding massage for neck tension/headache relief.

## 2015-06-15 NOTE — Progress Notes (Signed)
Pre visit review using our clinic review tool, if applicable. No additional management support is needed unless otherwise documented below in the visit note. 

## 2015-06-16 ENCOUNTER — Encounter: Payer: Self-pay | Admitting: Family

## 2015-06-16 DIAGNOSIS — R7989 Other specified abnormal findings of blood chemistry: Secondary | ICD-10-CM

## 2015-06-16 DIAGNOSIS — R945 Abnormal results of liver function studies: Secondary | ICD-10-CM

## 2015-06-18 ENCOUNTER — Other Ambulatory Visit: Payer: Self-pay | Admitting: Family

## 2015-06-18 ENCOUNTER — Ambulatory Visit (HOSPITAL_BASED_OUTPATIENT_CLINIC_OR_DEPARTMENT_OTHER)
Admission: RE | Admit: 2015-06-18 | Discharge: 2015-06-18 | Disposition: A | Discharge status: Home / Self Care | Payer: Commercial Managed Care - HMO | Source: Ambulatory Visit | Attending: Medical | Admitting: Medical

## 2015-06-18 ENCOUNTER — Other Ambulatory Visit: Payer: Self-pay | Admitting: Medical

## 2015-06-18 DIAGNOSIS — R51 Headache: Secondary | ICD-10-CM

## 2015-06-18 DIAGNOSIS — R519 Headache, unspecified: Secondary | ICD-10-CM

## 2015-06-18 DIAGNOSIS — R55 Syncope and collapse: Secondary | ICD-10-CM | POA: Insufficient documentation

## 2015-06-18 DIAGNOSIS — R945 Abnormal results of liver function studies: Secondary | ICD-10-CM

## 2015-06-18 DIAGNOSIS — R7989 Other specified abnormal findings of blood chemistry: Secondary | ICD-10-CM

## 2015-06-18 NOTE — Telephone Encounter (Signed)
Would you send that note to [facility] why he does not want mri now. And coordinate to get appointment for follow up since he is not getting imaging done.

## 2015-06-22 ENCOUNTER — Other Ambulatory Visit (HOSPITAL_BASED_OUTPATIENT_CLINIC_OR_DEPARTMENT_OTHER): Payer: Commercial Managed Care - HMO

## 2015-06-30 NOTE — Telephone Encounter (Signed)
Notified pt of results and he states that he only drinks 2-3 alcoholic beverages a week. Previously had gallbladder removed. Will proceed with abdominal u/s and was unable to reach radiology while pt was on the phone. Gave pt radiology # to call and schedule appt. Pt voices understanding.

## 2015-06-30 NOTE — Telephone Encounter (Signed)
Left message for pt to return my call.

## 2015-06-30 NOTE — Telephone Encounter (Signed)
Please contact pt re: unread mychart message 2/8? thanks

## 2015-07-02 ENCOUNTER — Encounter: Payer: Commercial Managed Care - HMO | Admitting: Gastroenterology

## 2015-07-05 ENCOUNTER — Encounter: Payer: Self-pay | Admitting: Family

## 2015-07-05 ENCOUNTER — Other Ambulatory Visit: Payer: Self-pay | Admitting: Family

## 2015-07-05 ENCOUNTER — Ambulatory Visit (HOSPITAL_BASED_OUTPATIENT_CLINIC_OR_DEPARTMENT_OTHER)
Admission: RE | Admit: 2015-07-05 | Discharge: 2015-07-05 | Disposition: A | Discharge status: Home / Self Care | Payer: 59 | Source: Ambulatory Visit | Attending: Family | Admitting: Family

## 2015-07-05 DIAGNOSIS — R7989 Other specified abnormal findings of blood chemistry: Secondary | ICD-10-CM | POA: Diagnosis not present

## 2015-07-05 DIAGNOSIS — R945 Abnormal results of liver function studies: Secondary | ICD-10-CM

## 2015-07-05 DIAGNOSIS — K760 Fatty (change of) liver, not elsewhere classified: Secondary | ICD-10-CM | POA: Insufficient documentation

## 2015-07-05 DIAGNOSIS — Z9049 Acquired absence of other specified parts of digestive tract: Secondary | ICD-10-CM | POA: Insufficient documentation

## 2015-07-06 ENCOUNTER — Telehealth: Payer: Self-pay | Admitting: Family

## 2015-07-06 MED ORDER — PANTOPRAZOLE SODIUM 40 MG PO TBEC: 40.0000 mg | DELAYED_RELEASE_TABLET | Freq: Two times a day (BID) | ORAL | Status: DC

## 2015-07-06 NOTE — Telephone Encounter (Signed)
Spoke with pharmacist, he states they never received Rx from 05/13/15. Gave verbal for #60 x 5 refills. Notified pt.

## 2015-07-06 NOTE — Telephone Encounter (Signed)
Relation to [id number] Call back number:[phone]   Reason for call:  Patient requesting a refill pantoprazole (PROTONIX) 40 MG tablet please send to [facility] [address] [place], [place], [place] [address] [phone]

## 2015-08-15 IMAGING — CR DG CHEST 2V
2 series · 2 of 2 positions shown · non-contrast
Comparison: None.

CLINICAL DATA: 48-year-old male with a history of pneumonia 1 week
prior.

EXAM:
CHEST - 2 VIEW

[w chest pa]
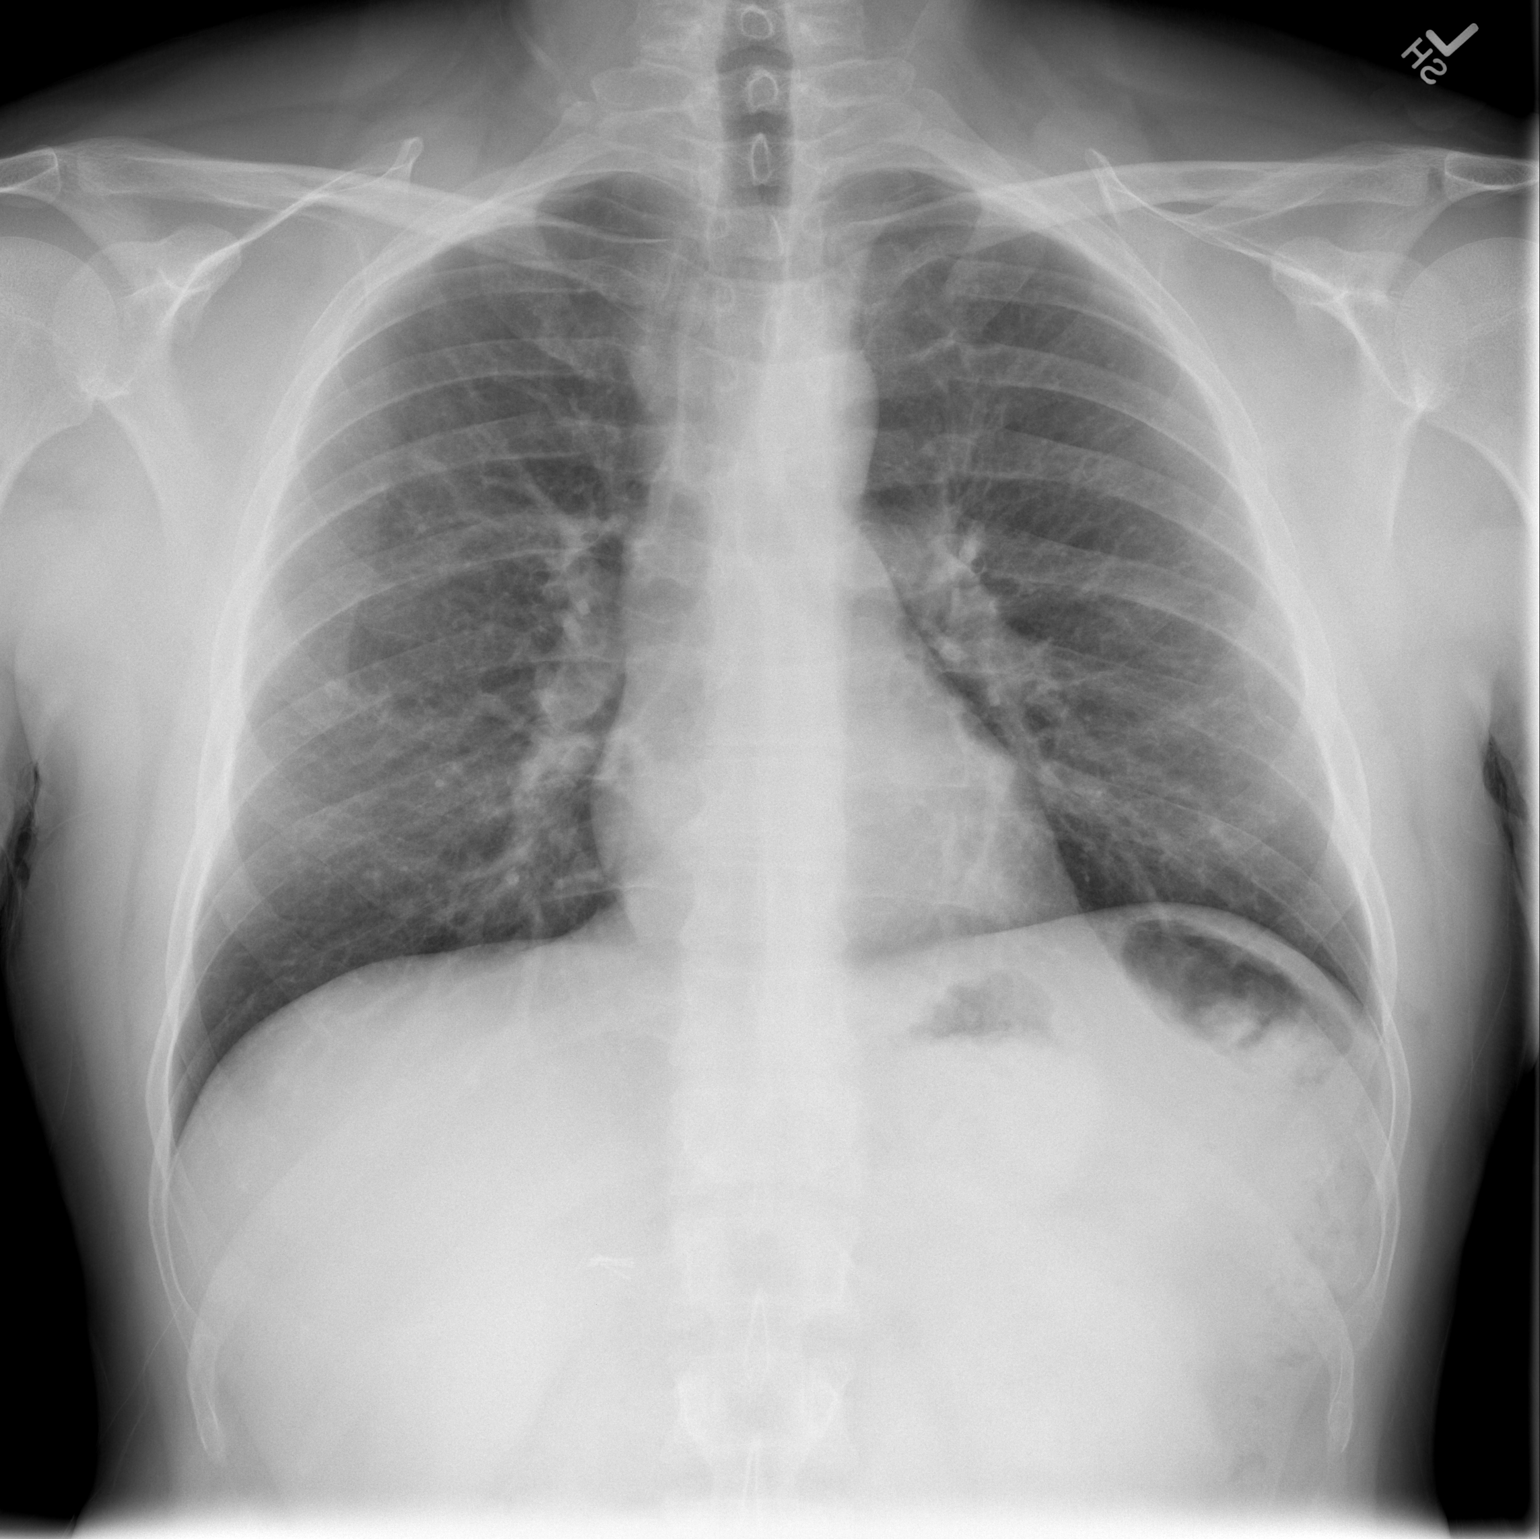

[w chest lat]
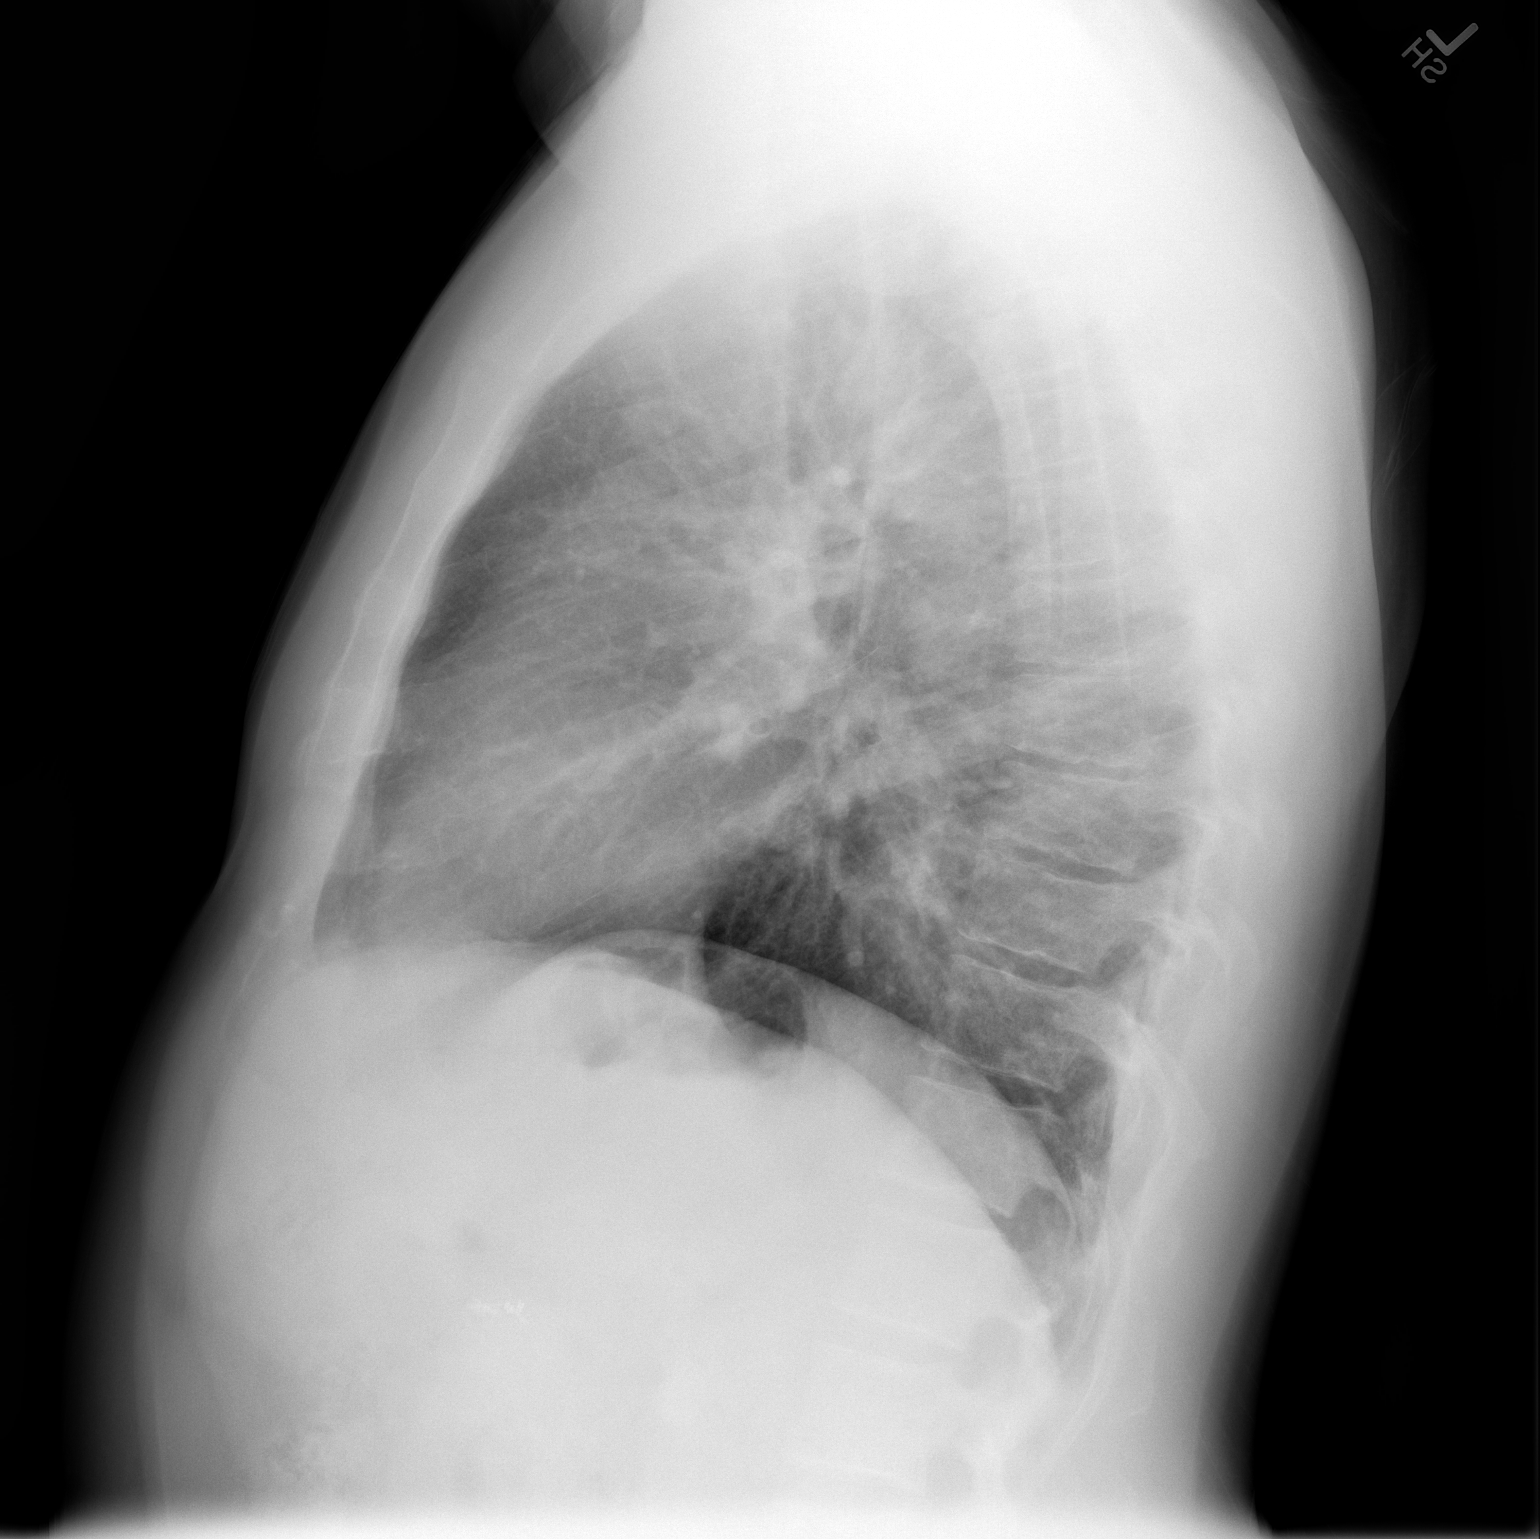

[2 of 2 positions shown; findings below may reference images not displayed]

FINDINGS: Cardiomediastinal silhouette projects within normal limits in size
and contour.

Low lung volumes. No confluent airspace disease, pneumothorax, or
pleural effusion. Coarsened interstitial markings/peribronchial
thickening, similar to prior.

No displaced fracture.

Unremarkable appearance of the upper abdomen.
IMPRESSION: No evidence of lobar pneumonia.

Coarsened interstitial markings are again evident in this patient
with a history of smoking. Changes may reflect low lung volumes,
though bronchitis/bronchopneumonia, or GE/SAPIA NOSA cannot be excluded.

## 2015-12-15 ENCOUNTER — Ambulatory Visit (INDEPENDENT_AMBULATORY_CARE_PROVIDER_SITE_OTHER): Payer: 59 | Admitting: Family

## 2015-12-15 ENCOUNTER — Encounter: Payer: Self-pay | Admitting: Family

## 2015-12-15 VITALS — BP 120/80 | HR 101 | Temp 98.1°F | Resp 16 | Ht 69.0 in | Wt 185.6 lb

## 2015-12-15 DIAGNOSIS — R21 Rash and other nonspecific skin eruption: Secondary | ICD-10-CM | POA: Diagnosis not present

## 2015-12-15 MED ORDER — PREDNISONE 10 MG PO TABS: ORAL_TABLET | ORAL | 0 refills | Status: DC

## 2015-12-15 MED ORDER — PERMETHRIN 5 % EX CREA: TOPICAL_CREAM | CUTANEOUS | 0 refills | Status: DC

## 2015-12-15 NOTE — Progress Notes (Signed)
Subjective:    Patient ID: Jonathan Powell, male    DOB: 09/16/1965, 50 y.o.   MRN: [id number]  HPI  Pt presents today with chief complaint of rash. Reports that he was given antibiotic (oral x 10 days) and steroid cream 2 weeks ago by [facility], however rash persists. Rash is improved but still itches. Initially spots were more inflammed prior to antibiotics. Reports that itching is "driving my crazy." Has not changed any detergents. Does travel frequently for work.  Not sleeping well due to the itching.   He reports that he has been working hard on healthy diet, exercise and weight loss.  Wt Readings from Last 3 Encounters:  12/15/15 185 lb 9.6 oz (84.2 kg)  06/15/15 198 lb (89.8 kg)  06/03/15 201 lb (91.2 kg)     Review of Systems See HPI    Past Medical History:  Diagnosis Date  . History of chicken pox   . History of kidney stones   . Pneumonia      Social History   Social History  . Marital status: Married    Spouse name: N/A  . Number of children: 2  . Years of education: N/A   Occupational History  . 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 Smoking status: Current Every Day Smoker    Packs/day: 1.00    Types: Cigarettes  . Smokeless tobacco: Never Used     Comment: using eCig 12/07/11  . Alcohol use 0.0 oz/week     Comment: rarely 2 per week  . Drug use: No  . Sexual activity: Not on file   Other Topics Concern  . Not on file   Social History Narrative   Occupation: vice Agricultural engineer, security company   Current Smoker-  2 PPD.   Alcohol use-yes,  very rare 1-2 drinks   Regular exercise-yes   Smoking Status:  current   Packs/Day:  1   Caffeine use/day:  3-4 daily   Does Patient Exercise:  Yes   Married- one biological son, one adopted son, step son       Past Surgical History:  Procedure Laterality Date  . CHOLECYSTECTOMY  05/2010   Dr Ninfa Linden  . FOOT SURGERY Right 2001   toe surgery    Family  History  Problem Relation Age of Onset  . Hyperlipidemia Mother   . Arthritis Maternal Grandmother   . Kidney failure Paternal Grandfather   . Alcohol abuse Paternal Grandfather     deceased  . ADD / ADHD Son     adopted  . Irritable bowel syndrome Mother     No Known Allergies  Current Outpatient Prescriptions on File Prior to Visit  Medication Sig Dispense Refill  . pantoprazole (PROTONIX) 40 MG tablet Take 1 tablet (40 mg total) by mouth 2 (two) times daily. (Patient taking differently: Take 40 mg by mouth daily. ) 60 tablet 5  . [DISCONTINUED] omeprazole (PRILOSEC) 20 MG capsule Take 1 capsule (20 mg total) by mouth daily. 30 capsule 0   No current facility-administered medications on file prior to visit.     BP 120/80   Pulse (!) 101   Temp 98.1 F (36.7 C) (Oral)   Resp 16   Ht 5\' 9"  (1.753 m)   Wt 185 lb 9.6 oz (84.2 kg)   SpO2 96% Comment: room air  BMI 27.41 kg/m       Objective:  Physical Exam  Constitutional: He is oriented to person, place, and time. He appears well-developed and well-nourished. No distress.  Neurological: He is alert and oriented to person, place, and time.  Psychiatric: He has a normal mood and affect. His behavior is normal. Judgment and thought content normal.  skin:  Rash noted bilateral inner thighs, few lesions on right dorsal foot.  Rash has small raised pink lesions.         Assessment & Plan:  Skin rash- looks like a resolving folliculitis, however I would not expect folliculitis to be so pruritic.  Scabies is a possibility given his recent travel. Will treat with oral prednisone for itching, permethrin. If symptoms worsen or do not improve, plan referral to dermatology.

## 2015-12-15 NOTE — Patient Instructions (Signed)
Begin prednisone for the itching. Apply permethrin cream head to toe, leave on for 8-10 hours then rinse off. Call if rash worsens or if it is not resolved in 1 week.

## 2015-12-15 NOTE — Progress Notes (Signed)
Pre visit review using our clinic review tool, if applicable. No additional management support is needed unless otherwise documented below in the visit note. 

## 2015-12-23 ENCOUNTER — Telehealth: Payer: Self-pay | Admitting: Family

## 2015-12-23 DIAGNOSIS — R21 Rash and other nonspecific skin eruption: Secondary | ICD-10-CM

## 2015-12-23 NOTE — Telephone Encounter (Signed)
Relation to [id number] Call back number:[phone]   Reason for call:  Patient requesting a referral to dermatologist due to rash on leg. Patient was informed to contact office if symptoms didn't improve from last OV on 12/15/15. Patient states symptoms are worst experiencing itchness and would like a dermatologist appt. tomorrow 12/24/15. Advised patient PCP is out of the office and will follow up tomorrow. Please advise

## 2015-12-24 ENCOUNTER — Encounter: Payer: Self-pay | Admitting: Family

## 2015-12-24 MED ORDER — PREDNISONE 10 MG PO TABS: ORAL_TABLET | ORAL | 0 refills | Status: DC

## 2015-12-24 NOTE — Telephone Encounter (Signed)
Pt called in to follow up on referral. Spoke with providers CMA via skype, as pt says that appt is needed urgently;   CMA replied:   Tell him Lenna Sciara is placing referral to [facility] dermatology for STAT appt. I cannot guarantee that anyone is able to see him tomorrow as I am not aware of any offices open on Saturday.     Informed pt of above message. He says that he is appreciative, also provided pt with phone number to location

## 2015-12-24 NOTE — Telephone Encounter (Signed)
Per verbal from PCP, ok to send prednisone taper and have pt take benadryl as needed for itching in place of chlorpheniramine Maleate. Rx sent. Notified pt and he voices understanding.

## 2015-12-24 NOTE — Telephone Encounter (Signed)
[facility], awaiting review by MD, they will call back and let us know when he can be worked in

## 2015-12-24 NOTE — Telephone Encounter (Signed)
Pt called back in, he says that [facility] can not see him until Monday. Pt would like to know if PCP could prescribe him something for itching and the rash that's on his leg? He says that he has seen PCP for concern.    Please advise.

## 2015-12-24 NOTE — Telephone Encounter (Signed)
Jonathan Powell-- can you call [facility] for ASAP appt. Today or Monday? Pt is very uncomfortable, intense itching and rash x 3-4 weeks. Thanks!  Per verbal from PCP, ok to place referral. Referral placed.

## 2016-05-05 ENCOUNTER — Encounter: Payer: Self-pay | Admitting: Family

## 2016-05-05 ENCOUNTER — Telehealth: Payer: Self-pay | Admitting: Family

## 2016-05-05 ENCOUNTER — Other Ambulatory Visit: Payer: Self-pay

## 2016-05-05 MED ORDER — ALPRAZOLAM 0.5 MG PO TABS: ORAL_TABLET | ORAL | 0 refills | Status: DC

## 2016-05-05 NOTE — Telephone Encounter (Signed)
Per Doc of the day Dr. Nani Ravens patient may have #10 .5mg  Xanax to help him get through the weekend after the lost of his mother. Patient was advised to schedule appointment with PCP for further refills and if needed. Rx was called in.  PC

## 2016-05-05 NOTE — Telephone Encounter (Signed)
Caller name: Mickel Baas  Relationship to patient: Wife Can be reached: [phone]  Pharmacy:  [facility] [place], [place] - [address] [place] [address] [phone] (Phone) [phone] (Fax     Reason for call: Patients wife called stating that patients mother passed away and the patient is experiencing a great deal of anxiety. Request a Rx called in to get patient through the funeral this weekend. ([name] O'Sullivan's patient)

## 2016-05-10 NOTE — Telephone Encounter (Signed)
Patient was scheduled to be evaluated for anxiety. TL/CMA

## 2016-05-12 ENCOUNTER — Encounter: Payer: Self-pay | Admitting: Family

## 2016-05-12 ENCOUNTER — Ambulatory Visit (INDEPENDENT_AMBULATORY_CARE_PROVIDER_SITE_OTHER): Payer: 59 | Admitting: Family

## 2016-05-12 VITALS — BP 133/72 | HR 70 | Temp 97.8°F | Resp 16 | Ht 69.0 in | Wt 191.6 lb

## 2016-05-12 DIAGNOSIS — F17200 Nicotine dependence, unspecified, uncomplicated: Secondary | ICD-10-CM | POA: Diagnosis not present

## 2016-05-12 DIAGNOSIS — F341 Dysthymic disorder: Secondary | ICD-10-CM | POA: Diagnosis not present

## 2016-05-12 DIAGNOSIS — E785 Hyperlipidemia, unspecified: Secondary | ICD-10-CM

## 2016-05-12 MED ORDER — VARENICLINE TARTRATE 0.5 MG X 11 & 1 MG X 42 PO MISC: ORAL | 0 refills | Status: DC

## 2016-05-12 NOTE — Assessment & Plan Note (Signed)
No depression currently. I think he is experiencing a normal grief reaction and response to a stressful family situation. We discussed establishing with a therapist and he is agreeable to do so.

## 2016-05-12 NOTE — Patient Instructions (Signed)
Begin chantix. Complete lab work prior to leaving. Please contact [facility] to schedule an appointment with a therapist.   [phone] Good luck quitting smoking.

## 2016-05-12 NOTE — Progress Notes (Signed)
Subjective:    Patient ID: Jonathan Powell, male    DOB: 15-Oct-1965, 51 y.o.   MRN: [id number]  HPI   Jonathan Powell is a 51 yr old male who presents today to discuss anxiety. His mother passed unexpectedly last week from presumed MI.  Notes stress due to issues with family members concerning her estate.  Has the image of finding her deceased and unresponsive in her home in his head.  Denies panic attacks.  Worries that he is at risk for MI as well. Still smoking.  Denies depression. He has been working out more and eating better.   Wt Readings from Last 3 Encounters:  05/12/16 191 lb 9.6 oz (86.9 kg)  12/15/15 185 lb 9.6 oz (84.2 kg)  06/15/15 198 lb (89.8 kg)      Review of Systems    see HPI  Past Medical History:  Diagnosis Date  . History of chicken pox   . History of kidney stones   . Pneumonia      Social History   Social History  . Marital status: Married    Spouse name: N/A  . Number of children: 2  . Years of education: N/A   Occupational History  . 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 Smoking status: Current Every Day Smoker    Packs/day: 1.00    Types: Cigarettes  . Smokeless tobacco: Never Used  . Alcohol use 0.0 oz/week     Comment: rarely 2 per week  . Drug use: No  . Sexual activity: Not on file   Other Topics Concern  . Not on file   Social History Narrative   Occupation: vice Agricultural engineer, security company   Current Smoker-  2 PPD.   Alcohol use-yes,  very rare 1-2 drinks   Regular exercise-yes   Smoking Status:  current   Packs/Day:  1   Caffeine use/day:  3-4 daily   Does Patient Exercise:  Yes   Married- one biological son, one adopted son, step son       Past Surgical History:  Procedure Laterality Date  . CHOLECYSTECTOMY  05/2010   Dr Ninfa Linden  . FOOT SURGERY Right 2001   toe surgery    Family History  Problem Relation Age of Onset  . Hyperlipidemia Mother   . Irritable bowel  syndrome Mother   . Arthritis Maternal Grandmother   . Kidney failure Paternal Grandfather   . Alcohol abuse Paternal Grandfather     deceased  . ADD / ADHD Son     adopted    No Known Allergies  Current Outpatient Prescriptions on File Prior to Visit  Medication Sig Dispense Refill  . ALPRAZolam (XANAX) 0.5 MG tablet TAKE 1 TABLET BY MOUTH EVERY 12 HOURS AS NEEDED FOR ANXIETY 10 tablet 0  . pantoprazole (PROTONIX) 40 MG tablet Take 1 tablet (40 mg total) by mouth 2 (two) times daily. (Patient taking differently: Take 40 mg by mouth daily. ) 60 tablet 5  . [DISCONTINUED] omeprazole (PRILOSEC) 20 MG capsule Take 1 capsule (20 mg total) by mouth daily. 30 capsule 0   No current facility-administered medications on file prior to visit.     BP 133/72 (BP Location: Left Arm, Cuff Size: Normal)   Pulse 70   Temp 97.8 F (36.6 C) (Oral)   Resp 16   Ht 5\' 9"  (1.753 m)   Wt 191 lb 9.6 oz (86.9 kg)  SpO2 99% Comment: room air  BMI 28.29 kg/m    Objective:   Physical Exam  Constitutional: He is oriented to person, place, and time. He appears well-developed and well-nourished. No distress.  Neurological: He is alert and oriented to person, place, and time.  Psychiatric: He has a normal mood and affect. His behavior is normal. Judgment and thought content normal.          Assessment & Plan:

## 2016-05-12 NOTE — Assessment & Plan Note (Signed)
We discussed that quitting smoking would be the best thing he could do to decrease his cardiovascular risk.  Will give trial of chantix. Common side effects including rare risk of suicide ideation was discussed with the patient today.  Patient is instructed to go directly to the ED if this occurs.  We discussed that patient can continue to smoke for 1 week after starting chantix, but then must discontinue cigarettes.  He is also instructed to contact us prior to completion of the starter month pack for an rx for the continuation month pack.  5 minutes spent with patient today on tobacco cessation counseling.

## 2016-05-12 NOTE — Progress Notes (Signed)
Pre visit review using our clinic review tool, if applicable. No additional management support is needed unless otherwise documented below in the visit note. 

## 2016-05-12 NOTE — Assessment & Plan Note (Signed)
Check follow up lipid panel.  He will schedule a CPX and we will plan a routine EKG at that time.

## 2016-05-13 ENCOUNTER — Encounter: Payer: Self-pay | Admitting: Family

## 2016-05-16 ENCOUNTER — Other Ambulatory Visit: Payer: 59

## 2016-05-16 LAB — LIPID PANEL
CHOLESTEROL: 239 mg/dL — AB (ref <200)
HDL: 34 mg/dL — ABNORMAL LOW (ref >40)
LDL Cholesterol: 172 mg/dL — ABNORMAL HIGH (ref <100)
Total CHOL/HDL Ratio: 7 Ratio — ABNORMAL HIGH (ref <5.0)
Triglycerides: 166 mg/dL — ABNORMAL HIGH (ref <150)
VLDL: 33 mg/dL — ABNORMAL HIGH (ref <30)

## 2016-05-16 NOTE — Telephone Encounter (Signed)
Received notification from [facility] that Chantix is requiring prior authorization and to complete via covermymeds or call [facility] at [phone].  Could not process via covermymeds due to message that pt was not found in their database. Called [facility] and spoke with Lesly Rubenstein and was told the same thing. Called pharmacy back and they provided another # for [facility] of [phone]. Spoke with Anitria and initiated PA. She will fax form to nurses station for completion as they need to know if pt has tried otc nicorette, nicotine gum or Thrive gum. Awaiting fax.

## 2016-05-17 ENCOUNTER — Telehealth: Payer: Self-pay | Admitting: Family

## 2016-05-17 ENCOUNTER — Encounter: Payer: Self-pay | Admitting: Family

## 2016-05-17 ENCOUNTER — Other Ambulatory Visit: Payer: Self-pay | Admitting: Physician Assistant

## 2016-05-17 MED ORDER — ATORVASTATIN CALCIUM 20 MG PO TABS: 20.0000 mg | ORAL_TABLET | Freq: Every day | ORAL | 5 refills | Status: DC

## 2016-05-17 MED ORDER — ASPIRIN EC 81 MG PO TBEC: 81.0000 mg | DELAYED_RELEASE_TABLET | Freq: Every day | ORAL | Status: DC

## 2016-05-17 NOTE — Telephone Encounter (Signed)
Sent to pt via mychart

## 2016-05-17 NOTE — Telephone Encounter (Signed)
Completed form and faxed to [facility] at [phone]. Awaiting determination.   Jonathan Powell  to Debbrah Alar, NP       05/17/16 2:49 PM  [name] left a message about insurance and Chantix. I have tried everything on the market, gum patches etc with no success. Wouldn't be will to do this if I hadn't.

## 2016-05-17 NOTE — Telephone Encounter (Signed)
Please let pt know that I reviewed his cholesterol. It is elevated.  Due to his family history of heart attack, I would recommend that he add lipitor and aspirin 81mg  once daily for cardiac prevention.

## 2016-05-17 NOTE — Telephone Encounter (Signed)
Received fax from [facility] for Needs to know if pt has previously tried: Bupropion, nicoderm CQ, gum, lozenge or Thrive gum, lozenge. Left detailed message on pt's cell # to call or send mychart message with response.

## 2016-05-17 NOTE — Telephone Encounter (Signed)
See 05/13/16 mychart message.

## 2016-05-19 ENCOUNTER — Telehealth: Payer: Self-pay | Admitting: Family

## 2016-05-19 ENCOUNTER — Emergency Department (HOSPITAL_BASED_OUTPATIENT_CLINIC_OR_DEPARTMENT_OTHER)
Admission: EM | Admit: 2016-05-19 | Discharge: 2016-05-19 | Disposition: A | Discharge status: Home / Self Care | Payer: 59 | Attending: Emergency Medicine | Admitting: Emergency Medicine

## 2016-05-19 ENCOUNTER — Encounter (HOSPITAL_BASED_OUTPATIENT_CLINIC_OR_DEPARTMENT_OTHER): Payer: Self-pay | Admitting: *Deleted

## 2016-05-19 DIAGNOSIS — R5383 Other fatigue: Secondary | ICD-10-CM | POA: Insufficient documentation

## 2016-05-19 DIAGNOSIS — F17210 Nicotine dependence, cigarettes, uncomplicated: Secondary | ICD-10-CM | POA: Diagnosis not present

## 2016-05-19 DIAGNOSIS — R5381 Other malaise: Secondary | ICD-10-CM | POA: Diagnosis not present

## 2016-05-19 DIAGNOSIS — Z7982 Long term (current) use of aspirin: Secondary | ICD-10-CM | POA: Insufficient documentation

## 2016-05-19 DIAGNOSIS — F419 Anxiety disorder, unspecified: Secondary | ICD-10-CM

## 2016-05-19 DIAGNOSIS — R42 Dizziness and giddiness: Secondary | ICD-10-CM | POA: Insufficient documentation

## 2016-05-19 DIAGNOSIS — Z79899 Other long term (current) drug therapy: Secondary | ICD-10-CM | POA: Diagnosis not present

## 2016-05-19 DIAGNOSIS — R52 Pain, unspecified: Secondary | ICD-10-CM | POA: Diagnosis present

## 2016-05-19 LAB — CBG MONITORING, ED: Glucose-Capillary: 135 mg/dL — ABNORMAL HIGH (ref 65–99)

## 2016-05-19 MED ORDER — ESCITALOPRAM OXALATE 10 MG PO TABS: ORAL_TABLET | ORAL | 0 refills | Status: DC

## 2016-05-19 NOTE — ED Triage Notes (Signed)
Presents with HA, body aches, fatigue, indigestion

## 2016-05-19 NOTE — Telephone Encounter (Signed)
Noted. Per the patient's chart, he was seen at the ED.

## 2016-05-19 NOTE — ED Notes (Signed)
Denies any N/V/D

## 2016-05-19 NOTE — ED Provider Notes (Signed)
[facility] DEPT Provider Note   CSN: [id number] Arrival date & time: 05/19/16 1035     History    Chief Complaint  Patient presents with  . Generalized Body Aches     HPI Jonathan Powell is a 51 y.o. male.  51yo M who p/w body aches, fatigue, and anxiety. Pt was recently started on lipitor and took his first dose yesterday morning. By yesterday evening, he began having Generalized fatigue and body aches. He woke up this morning and took a second dose and reports that these symptoms continued as well as dizziness, hot and cold sensation, generalized malaise, indigestion, and headache. He does endorse being anxious as his mother just died 2 weeks ago from an MI and he is under a lot of stress related to this. He is not sure whether these symptoms are related to the anxiety itself or are related to a medication reaction. He denies any muscle cramping or muscle pain. No dark urine. No fevers, vomiting, diarrhea, or cough/cold symptoms. No sick contacts or recent travel. No chest pain or shortness of breath.   Past Medical History:  Diagnosis Date  . History of chicken pox   . History of kidney stones   . Pneumonia      Patient Active Problem List   Diagnosis Date Noted  . Hyperlipidemia 05/12/2016  . Fatty liver 07/05/2015  . Preventative health care 12/07/2014  . GERD (gastroesophageal reflux disease) 10/27/2014  . Elevated LFTs 04/26/2011  . Skin lesion of face 04/26/2011  . ANXIETY DEPRESSION 11/24/2009  . TOBACCO ABUSE 11/24/2009    Past Surgical History:  Procedure Laterality Date  . CHOLECYSTECTOMY    . FOOT SURGERY Right 2001   toe surgery        Home Medications    Prior to Admission medications   Medication Sig Start Date End Date Taking? Authorizing Provider  aspirin EC 81 MG tablet Take 1 tablet (81 mg total) by mouth daily. 05/17/16  Yes Debbrah Alar, NP  atorvastatin (LIPITOR) 20 MG tablet Take 1 tablet (20 mg total) by mouth daily.  05/17/16  Yes Debbrah Alar, NP  pantoprazole (PROTONIX) 40 MG tablet Take 1 tablet (40 mg total) by mouth 2 (two) times daily. Patient taking differently: Take 40 mg by mouth daily.  07/06/15  Yes Debbrah Alar, NP  ALPRAZolam Duanne Moron) 0.5 MG tablet TAKE 1 TABLET BY MOUTH EVERY 12 HOURS AS NEEDED FOR ANXIETY 05/05/16   Crosby Oyster Wendling, DO  pantoprazole (PROTONIX) 40 MG tablet Take 1 tablet (40 mg total) by mouth 2 (two) times daily. 05/17/16   Amy S Esterwood, PA-C  varenicline (CHANTIX STARTING MONTH PAK) 0.5 MG X 11 & 1 MG X 42 tablet Take one 0.5 mg tablet by mouth once daily for 3 days, then increase to one 0.5 mg tablet twice daily for 4 days, then increase to one 1 mg tablet twice daily. 05/12/16   Debbrah Alar, NP      Family History  Problem Relation Age of Onset  . Hyperlipidemia Mother   . Irritable bowel syndrome Mother   . Arthritis Maternal Grandmother   . Kidney failure Paternal Grandfather   . Alcohol abuse Paternal Grandfather     deceased  . ADD / ADHD Son     adopted     Social History  Substance Use Topics  . Smoking status: Current Every Day Smoker    Packs/day: 1.00    Types: Cigarettes  . Smokeless tobacco: Never Used  .  Alcohol use 0.0 oz/week     Comment: social     Allergies     Patient has no known allergies.    Review of Systems  10 Systems reviewed and are negative for acute change except as noted in the HPI.   Physical Exam Updated Vital Signs BP 128/86 (BP Location: Right Arm)   Pulse 70   Temp 97.8 F (36.6 C) (Oral)   Resp 20   Wt 185 lb (83.9 kg)   SpO2 100%   BMI 27.32 kg/m   Physical Exam  Constitutional: He is oriented to person, place, and time. He appears well-developed and well-nourished. No distress.  HENT:  Head: Normocephalic and atraumatic.  Moist mucous membranes  Eyes: Conjunctivae are normal. Pupils are equal, round, and reactive to light.  Neck: Neck supple.  Cardiovascular: Normal rate,  regular rhythm and normal heart sounds.   No murmur heard. Pulmonary/Chest: Effort normal and breath sounds normal.  Abdominal: Soft. Bowel sounds are normal. He exhibits no distension. There is no tenderness.  Musculoskeletal: He exhibits no edema.  Neurological: He is alert and oriented to person, place, and time.  Fluent speech  Skin: Skin is warm and dry.  Psychiatric: Judgment normal.  Mildly anxious  Nursing note and vitals reviewed.     ED Treatments / Results  Labs (all labs ordered are listed, but only abnormal results are displayed) Labs Reviewed  CBG MONITORING, ED     EKG  EKG Interpretation  Date/Time:    Ventricular Rate:    PR Interval:    QRS Duration:   QT Interval:    QTC Calculation:   R Axis:     Text Interpretation:           Radiology No results found.  Procedures Procedures (including critical care time) Procedures  Medications Ordered in ED  Medications - No data to display   Initial Impression / Assessment and Plan / ED Course  I have reviewed the triage vital signs and the nursing notes.  Pertinent labs that were available during my care of the patient were reviewed by me and considered in my medical decision making (see chart for details).  Clinical Course    PT w/ multiple sx including fatigue, malaise, anxiety, dizziness, hot and cold sensation. Just started Lipitor yesterday and is concerned about possible side effects. He was mildly anxious but well-appearing on exam with normal vital signs. No chest pain or shortness of breath and no severe myalgias or muscle cramping to suggest a life-threatening reaction to statin. I reviewed his chart where his PCP documented a recent visit for anxiety related to his mother's death. His symptoms are nonspecific and could be related to medication side effect but they could also represent onset of a viral process. I recommended skipping tomorrow's dose of medications to see if his symptoms  improve. If they're significantly better, he can hold the medication until follow-up with PCP. If skipping the medication is not change in symptoms and I feel it is unlikely to be related to the medication itself. Discussed supportive care and ways to reduce stress. Reviewed return precautions. Patient voiced understanding was discharged in satisfactory condition.  Final Clinical Impressions(s) / ED Diagnoses   Final diagnoses:  Malaise and fatigue  Anxiety  Dizziness     New Prescriptions   No medications on file       Sharlett Iles, MD 05/19/16 1129

## 2016-05-19 NOTE — Telephone Encounter (Signed)
See team health triage note.

## 2016-05-19 NOTE — Telephone Encounter (Signed)
Noted  

## 2016-05-19 NOTE — Telephone Encounter (Signed)
Relation to [id number] Call back number:[phone]  Reason for call:  Patient transferred to team health stating he feels light headed and it maybe due to the atorvastatin (LIPITOR) 20 MG tablet, please advise NP does have a 1:45pm, please advise.

## 2016-05-19 NOTE — Telephone Encounter (Signed)
PLEASE NOTE: All timestamps contained within this report are represented as [place] Standard Time. CONFIDENTIALTY NOTICE: This fax transmission is intended only for the addressee. It contains information that is legally privileged, confidential or otherwise protected from use or disclosure. If you are not the intended recipient, you are strictly prohibited from reviewing, disclosing, copying using or disseminating any of this information or taking any action in reliance on or regarding this information. If you have received this fax in error, please notify us immediately by telephone so that we can arrange for its return to [place]. Phone: [phone], Toll-Free: [phone], Fax: [phone] Page: 1 of 1 Call Id: [id number] [facility] Patient Name: Jonathan Powell NE DOB: November 16, 1965 Initial Comment Caller states saw dr earlier in week, just started Lipitor, feels tingly and loopy, feeling like he could pass out Nurse Assessment Nurse: Andria Frames, RN, Aeriel Date/Time (Eastern Time): 05/19/2016 10:09:57 AM Confirm and document reason for call. If symptomatic, describe symptoms. ---Caller states saw dr earlier in week, just started Lipitor, feels tingly and dizzy, feeling like he could pass out. This happens to him off and on since he started taking the medication yesterday morning. He had a couple of sips of wine last night he had some wine and he got a chill. He still feels tingly his head feels dizzy. Unsure of fever. Does the patient have any new or worsening symptoms? ---Yes Will a triage be completed? ---Yes Related visit to physician within the last 2 weeks? ---Yes Does the PT have any chronic conditions? (i.e. diabetes, asthma, etc.) ---Yes List chronic conditions. ---high cholesterol Is this a behavioral health or substance abuse call? ---No Guidelines Guideline Title Affirmed Question Affirmed  Notes Neurologic Deficit Patient sounds very sick or weak to the triager Dizziness - Lightheadedness SEVERE dizziness (e.g., unable to stand, requires support to walk, feels like passing out now) Final Disposition User Go to ED Now Hensel, RN, Aeriel Comments Nurse choice and upgrade related to pt having tingling and dizziness. Referrals [facility] - ED Disagree/Comply: Comply Disagree/Comply: Comply

## 2016-05-22 NOTE — Telephone Encounter (Signed)
Received determination from [facility] that chantix request has been denied because pt has not tried bupropion. Please advise?

## 2016-05-26 ENCOUNTER — Ambulatory Visit (INDEPENDENT_AMBULATORY_CARE_PROVIDER_SITE_OTHER): Payer: 59 | Admitting: Family

## 2016-05-26 ENCOUNTER — Encounter: Payer: Self-pay | Admitting: Family

## 2016-05-26 VITALS — BP 122/78 | HR 69 | Temp 98.2°F | Resp 16 | Ht 69.0 in | Wt 184.0 lb

## 2016-05-26 DIAGNOSIS — F341 Dysthymic disorder: Secondary | ICD-10-CM

## 2016-05-26 DIAGNOSIS — Z0000 Encounter for general adult medical examination without abnormal findings: Secondary | ICD-10-CM

## 2016-05-26 DIAGNOSIS — B079 Viral wart, unspecified: Secondary | ICD-10-CM | POA: Diagnosis not present

## 2016-05-26 DIAGNOSIS — Z23 Encounter for immunization: Secondary | ICD-10-CM

## 2016-05-26 DIAGNOSIS — F17200 Nicotine dependence, unspecified, uncomplicated: Secondary | ICD-10-CM

## 2016-05-26 LAB — BASIC METABOLIC PANEL
BUN: 15 mg/dL (ref 6–23)
CALCIUM: 9.7 mg/dL (ref 8.4–10.5)
CO2: 28 mEq/L (ref 19–32)
CREATININE: 0.98 mg/dL (ref 0.40–1.50)
Chloride: 103 mEq/L (ref 96–112)
GFR: 86.49 mL/min (ref >60.00)
Glucose, Bld: 110 mg/dL — ABNORMAL HIGH (ref 70–99)
Potassium: 4 mEq/L (ref 3.5–5.1)
SODIUM: 138 meq/L (ref 135–145)

## 2016-05-26 LAB — CBC WITH DIFFERENTIAL/PLATELET
BASOS ABS: 0 10*3/uL (ref 0.0–0.1)
Basophils Relative: 0.2 % (ref 0.0–3.0)
EOS ABS: 0 10*3/uL (ref 0.0–0.7)
Eosinophils Relative: 0.2 % (ref 0.0–5.0)
HEMATOCRIT: 46.5 % (ref 39.0–52.0)
HEMOGLOBIN: 15.4 g/dL (ref 13.0–17.0)
LYMPHS PCT: 16.2 % (ref 12.0–46.0)
Lymphs Abs: 1.9 10*3/uL (ref 0.7–4.0)
MCHC: 33.1 g/dL (ref 30.0–36.0)
MCV: 91.5 fl (ref 78.0–100.0)
Monocytes Absolute: 0.6 10*3/uL (ref 0.1–1.0)
Monocytes Relative: 5 % (ref 3.0–12.0)
NEUTROS ABS: 9.1 10*3/uL — AB (ref 1.4–7.7)
Neutrophils Relative %: 78.4 % — ABNORMAL HIGH (ref 43.0–77.0)
PLATELETS: 181 10*3/uL (ref 150.0–400.0)
RBC: 5.09 Mil/uL (ref 4.22–5.81)
RDW: 13.4 % (ref 11.5–15.5)
WBC: 11.6 10*3/uL — AB (ref 4.0–10.5)

## 2016-05-26 LAB — LIPID PANEL
CHOL/HDL RATIO: 5
Cholesterol: 246 mg/dL — ABNORMAL HIGH (ref 0–200)
HDL: 44.8 mg/dL (ref >39.00)
LDL Cholesterol: 182 mg/dL — ABNORMAL HIGH (ref 0–99)
NONHDL: 201.1
Triglycerides: 97 mg/dL (ref 0.0–149.0)
VLDL: 19.4 mg/dL (ref 0.0–40.0)

## 2016-05-26 LAB — URINALYSIS, ROUTINE W REFLEX MICROSCOPIC
Bilirubin Urine: NEGATIVE
Hgb urine dipstick: NEGATIVE
Ketones, ur: NEGATIVE
Leukocytes, UA: NEGATIVE
Nitrite: NEGATIVE
PH: 6 (ref 5.0–8.0)
RBC / HPF: NONE SEEN (ref 0–?)
Total Protein, Urine: NEGATIVE
UROBILINOGEN UA: 0.2 (ref 0.0–1.0)
Urine Glucose: NEGATIVE
WBC, UA: NONE SEEN (ref 0–?)

## 2016-05-26 LAB — HEPATIC FUNCTION PANEL
ALK PHOS: 118 U/L — AB (ref 39–117)
ALT: 73 U/L — ABNORMAL HIGH (ref 0–53)
AST: 33 U/L (ref 0–37)
Albumin: 4.3 g/dL (ref 3.5–5.2)
BILIRUBIN DIRECT: 0.1 mg/dL (ref 0.0–0.3)
TOTAL PROTEIN: 7.2 g/dL (ref 6.0–8.3)
Total Bilirubin: 0.4 mg/dL (ref 0.2–1.2)

## 2016-05-26 LAB — TSH: TSH: 0.54 u[IU]/mL (ref 0.35–4.50)

## 2016-05-26 NOTE — Assessment & Plan Note (Signed)
Encouraged pt to continue healthy diet, exercise and weight loss. EKG tracing is personally reviewed.  EKG notes NSR.  No acute changes.   Pt would like flu shot today. Obtain routine lab work.

## 2016-05-26 NOTE — Addendum Note (Signed)
Addended by: Kelle Darting A on: 05/26/2016 05:55 PM   Modules accepted: Orders

## 2016-05-26 NOTE — Patient Instructions (Addendum)
Please continue healthy diet and exercise. Complete lab work prior to leaving.  Follow up before you run out of your first month of lexapro.

## 2016-05-26 NOTE — Telephone Encounter (Signed)
Spoke with Tiffany at [facility] re: quantity discrepancy. She states they will get the remaining 13 tablets ready as 27 would be a 30 day supply based on current directions. Pt can come pick them up anytime.

## 2016-05-26 NOTE — Assessment & Plan Note (Signed)
Anxiety remains uncontrolled.  He just started the 10mg  dose of lexapro yesterday. Advised pt to continue lexapro. Expect symptoms will improve in the coming weeks.  He is also scheduled to establish with a therapist in 1 week which I think will really help him.

## 2016-05-26 NOTE — Assessment & Plan Note (Signed)
Insurance would not cover chantix.  Was cost prohibitive out of pocket. He plans to try the patch.

## 2016-05-26 NOTE — Progress Notes (Signed)
Subjective:    Patient ID: Jonathan Powell, male    DOB: July 15, 1965, 51 y.o.   MRN: [id number]  HPI  Patient presents today for complete physical.  Immunizations: tetanus is up to date. He is agreeable to flu shot today.  Diet: healthy Exercise: 5 days a week  Anxiety- reports that he feels like his head is light. Vision is blurry at times.  Feels hot then cold.  Feels nervous.  "like something is wrong." Sleeping well.  Denies depression.  He began lexapro. Began lexapro 10mg  once daily.    Review of Systems  Constitutional: Negative for unexpected weight change.  HENT: Negative for hearing loss and rhinorrhea.   Eyes: Positive for visual disturbance.  Respiratory: Negative for cough.   Cardiovascular: Negative for chest pain and leg swelling.  Gastrointestinal: Negative for constipation and diarrhea.  Genitourinary: Negative for dysuria and frequency.  Musculoskeletal: Negative for arthralgias and myalgias.  Skin: Negative for rash.       Wart on right forearm  Neurological:       Reports occasional HA's.   Hematological: Negative for adenopathy.  Psychiatric/Behavioral:       See HPI   Past Medical History:  Diagnosis Date  . History of chicken pox   . History of kidney stones   . Pneumonia      Social History   Social History  . Marital status: Married    Spouse name: N/A  . Number of children: 2  . Years of education: N/A   Occupational History  . 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 Smoking status: Current Every Day Smoker    Packs/day: 1.00    Types: Cigarettes  . Smokeless tobacco: Never Used  . Alcohol use 0.0 oz/week     Comment: social  . Drug use: No  . Sexual activity: Yes   Other Topics Concern  . Not on file   Social History Narrative   Occupation: vice Agricultural engineer, security company   Current Smoker-  2 PPD.   Alcohol use-yes,  very rare 1-2 drinks   Regular exercise-yes   Smoking Status:   current   Packs/Day:  1   Caffeine use/day:  3-4 daily   Does Patient Exercise:  Yes   Married- one biological son, one adopted son, step son       Past Surgical History:  Procedure Laterality Date  . CHOLECYSTECTOMY    . FOOT SURGERY Right 2001   toe surgery    Family History  Problem Relation Age of Onset  . Hyperlipidemia Mother   . Irritable bowel syndrome Mother   . Heart attack Mother   . Arthritis Maternal Grandmother   . Kidney failure Paternal Grandfather   . Alcohol abuse Paternal Grandfather     deceased  . ADD / ADHD Son     adopted    Allergies  Allergen Reactions  . Atorvastatin Other (See Comments)    "disoriented, chills"    Current Outpatient Prescriptions on File Prior to Visit  Medication Sig Dispense Refill  . aspirin EC 81 MG tablet Take 1 tablet (81 mg total) by mouth daily.    Marland Kitchen escitalopram (LEXAPRO) 10 MG tablet 1/2 tab by mouth once daily for 1 week, then increase to a full tab once daily on week two. 30 tablet 0  . pantoprazole (PROTONIX) 40 MG tablet Take 1 tablet (40 mg total) by mouth 2 (two)  times daily. 60 tablet 0  . ALPRAZolam (XANAX) 0.5 MG tablet TAKE 1 TABLET BY MOUTH EVERY 12 HOURS AS NEEDED FOR ANXIETY (Patient not taking: Reported on 05/26/2016) 10 tablet 0  . [DISCONTINUED] omeprazole (PRILOSEC) 20 MG capsule Take 1 capsule (20 mg total) by mouth daily. 30 capsule 0   No current facility-administered medications on file prior to visit.     BP 122/78 (BP Location: Right Arm, Cuff Size: Normal)   Pulse 69   Temp 98.2 F (36.8 C) (Oral)   Resp 16   Ht 5\' 9"  (1.753 m)   Wt 184 lb (83.5 kg)   SpO2 100% Comment: room air  BMI 27.17 kg/m       Objective:   Physical Exam Physical Exam  Constitutional: He is oriented to person, place, and time. He appears well-developed and well-nourished. No distress.  HENT:  Head: Normocephalic and atraumatic.  Right Ear: Tympanic membrane and ear canal normal.  Left Ear: Tympanic  membrane and ear canal normal.  Mouth/Throat: Oropharynx is clear and moist.  Eyes: Pupils are equal, round, and reactive to light. No scleral icterus.  Neck: Normal range of motion. No thyromegaly present.  Cardiovascular: Normal rate and regular rhythm.   No murmur heard. Pulmonary/Chest: Effort normal and breath sounds normal. No respiratory distress. He has no wheezes. He has no rales. He exhibits no tenderness.  Abdominal: Soft. Bowel sounds are normal. He exhibits no distension and no mass. There is no tenderness. There is no rebound and no guarding.  Musculoskeletal: He exhibits no edema.  Lymphadenopathy:    He has no cervical adenopathy.  Neurological: He is alert and oriented to person, place, and time. He has normal patellar reflexes. He exhibits normal muscle tone. Coordination normal.  Skin: Skin is warm and dry. (cluster of warts noted on right forearm)  Bilateral forearm tatoos. Psychiatric: He has a normal mood and affect. His behavior is normal. Judgment and thought content normal.           Assessment & Plan:          Assessment & Plan:  Wart- after verbal consent was obtained. 3 warts were frozen on right forearm (freeze/thaw x 3). Pt tolerated procedure well.    15 min spent counseling patient on anxiety, smoking cessation.

## 2016-05-26 NOTE — Progress Notes (Signed)
Pre visit review using our clinic review tool, if applicable. No additional management support is needed unless otherwise documented below in the visit note. 

## 2016-05-28 ENCOUNTER — Encounter: Payer: Self-pay | Admitting: Family

## 2016-05-31 DIAGNOSIS — M531 Cervicobrachial syndrome: Secondary | ICD-10-CM | POA: Diagnosis not present

## 2016-05-31 DIAGNOSIS — M9901 Segmental and somatic dysfunction of cervical region: Secondary | ICD-10-CM | POA: Diagnosis not present

## 2016-05-31 DIAGNOSIS — M9902 Segmental and somatic dysfunction of thoracic region: Secondary | ICD-10-CM | POA: Diagnosis not present

## 2016-06-03 ENCOUNTER — Other Ambulatory Visit: Payer: Self-pay | Admitting: Physician Assistant

## 2016-06-14 ENCOUNTER — Encounter: Payer: Self-pay | Admitting: Family

## 2016-06-14 ENCOUNTER — Ambulatory Visit (INDEPENDENT_AMBULATORY_CARE_PROVIDER_SITE_OTHER): Payer: 59 | Admitting: Family

## 2016-06-14 DIAGNOSIS — K219 Gastro-esophageal reflux disease without esophagitis: Secondary | ICD-10-CM

## 2016-06-14 DIAGNOSIS — F341 Dysthymic disorder: Secondary | ICD-10-CM

## 2016-06-14 MED ORDER — ESCITALOPRAM OXALATE 20 MG PO TABS: 20.0000 mg | ORAL_TABLET | Freq: Every day | ORAL | 1 refills | Status: DC

## 2016-06-14 NOTE — Assessment & Plan Note (Signed)
Uncontrolled. Reinforced importance of gerd diet and quitting smoking. We did discuss referral back to GI but he declines at this time.

## 2016-06-14 NOTE — Progress Notes (Signed)
Pre visit review using our clinic review tool, if applicable. No additional management support is needed unless otherwise documented below in the visit note. 

## 2016-06-14 NOTE — Assessment & Plan Note (Signed)
Anxiety symptoms are unchanged.  He has not been able to establish with his therapist due to snow cancellation. Will increase lexapro to 20mg  once daily. Advised pt to try adding one tablet of benadryl at bedtime as needed.

## 2016-06-14 NOTE — Patient Instructions (Signed)
Please increase your lexapro to 20mg  once daily.     Food Choices for Gastroesophageal Reflux Disease, Adult When you have gastroesophageal reflux disease (GERD), the foods you eat and your eating habits are very important. Choosing the right foods can help ease the discomfort of GERD. What general guidelines do I need to follow?  Choose fruits, vegetables, whole grains, low-fat dairy products, and low-fat meat, fish, and poultry.  Limit fats such as oils, salad dressings, butter, nuts, and avocado.  Keep a food diary to identify foods that cause symptoms.  Avoid foods that cause reflux. These may be different for different people.  Eat frequent small meals instead of three large meals each day.  Eat your meals slowly, in a relaxed setting.  Limit fried foods.  Cook foods using methods other than frying.  Avoid drinking alcohol.  Avoid drinking large amounts of liquids with your meals.  Avoid bending over or lying down until 2-3 hours after eating. What foods are not recommended? The following are some foods and drinks that may worsen your symptoms: Vegetables  Tomatoes. Tomato juice. Tomato and spaghetti sauce. Chili peppers. Onion and garlic. Horseradish. Fruits  Oranges, grapefruit, and lemon (fruit and juice). Meats  High-fat meats, fish, and poultry. This includes hot dogs, ribs, ham, sausage, salami, and bacon. Dairy  Whole milk and chocolate milk. Sour cream. Cream. Butter. Ice cream. Cream cheese. Beverages  Coffee and tea, with or without caffeine. Carbonated beverages or energy drinks. Condiments  Hot sauce. Barbecue sauce. Sweets/Desserts  Chocolate and cocoa. Donuts. Peppermint and spearmint. Fats and Oils  High-fat foods, including [place] fries and potato chips. Other  Vinegar. Strong spices, such as black pepper, white pepper, red pepper, cayenne, curry powder, cloves, ginger, and chili powder. The items listed above may not be a complete list of foods  and beverages to avoid. Contact your dietitian for more information.  This information is not intended to replace advice given to you by your health care provider. Make sure you discuss any questions you have with your health care provider. Document Released: 04/24/2005 Document Revised: 09/30/2015 Document Reviewed: 02/26/2013 Elsevier Interactive Patient Education  2017 [employer].

## 2016-06-14 NOTE — Progress Notes (Signed)
Subjective:    Patient ID: Jonathan Powell, male    DOB: [birth year], 51 y.o.   MRN: [id number]  HPI  Mr. Vic Ripper is a 51 yr old male who presents today for follow up of his anxiety.  He was started on lexapro 10mg  one month ago following the death of his mother for anxiety symptoms. Reports that he continues to have anxiety symptoms, light headed sob, anxious.  If he can lay down he does.    Does report some diarrhea, not a real problem for him.  Thinks it may be related to the lexapro.   Notes constant GERD symptoms despite use of protonix.  Switched to caffeine free coffee.  Notes that his symptoms do seem to worsen after he smokes.  Reports that he has not yet started lipitor- wanted to wait until he felt better from an anxiety standpoint.   Review of Systems See HPI  Past Medical History:  Diagnosis Date  . History of chicken pox   . History of kidney stones   . Pneumonia      Social History   Social History  . Marital status: Married    Spouse name: N/A  . Number of children: 2  . Years of education: N/A   Occupational History  . 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 Smoking status: Current Every Day Smoker    Packs/day: 1.00    Types: Cigarettes  . Smokeless tobacco: Never Used  . Alcohol use 0.0 oz/week     Comment: social  . Drug use: No  . Sexual activity: Yes   Other Topics Concern  . Not on file   Social History Narrative   Occupation: vice Agricultural engineer, security company   Current Smoker-  2 PPD.   Alcohol use-yes,  very rare 1-2 drinks   Regular exercise-yes   Smoking Status:  current   Packs/Day:  1   Caffeine use/day:  3-4 daily   Does Patient Exercise:  Yes   Married- one biological son, one adopted son, step son       Past Surgical History:  Procedure Laterality Date  . CHOLECYSTECTOMY    . FOOT SURGERY Right 2001   toe surgery    Family History  Problem Relation Age of Onset  .  Hyperlipidemia Mother   . Irritable bowel syndrome Mother   . Heart attack Mother   . Arthritis Maternal Grandmother   . Kidney failure Paternal Grandfather   . Alcohol abuse Paternal Grandfather     deceased  . ADD / ADHD Son     adopted    No Active Allergies  Current Outpatient Prescriptions on File Prior to Visit  Medication Sig Dispense Refill  . aspirin EC 81 MG tablet Take 1 tablet (81 mg total) by mouth daily.    . pantoprazole (PROTONIX) 40 MG tablet Take 1 tablet (40 mg total) by mouth 2 (two) times daily. 60 tablet 0  . ALPRAZolam (XANAX) 0.5 MG tablet TAKE 1 TABLET BY MOUTH EVERY 12 HOURS AS NEEDED FOR ANXIETY (Patient not taking: Reported on 06/14/2016) 10 tablet 0  . [DISCONTINUED] omeprazole (PRILOSEC) 20 MG capsule Take 1 capsule (20 mg total) by mouth daily. 30 capsule 0   No current facility-administered medications on file prior to visit.     BP 117/65 (BP Location: Right Arm, Cuff Size: Large)   Pulse 76   Temp 98.1 F (36.7 C) (Oral)  Ht 5\' 9"  (1.753 m)   Wt 195 lb 12.8 oz (88.8 kg)   SpO2 99% Comment: room air  BMI 28.91 kg/m       Objective:   Physical Exam  Constitutional: He is oriented to person, place, and time. He appears well-developed and well-nourished. No distress.  HENT:  Head: Normocephalic and atraumatic.  Cardiovascular: Normal rate and regular rhythm.   No murmur heard. Pulmonary/Chest: Effort normal and breath sounds normal. No respiratory distress. He has no wheezes. He has no rales.  Musculoskeletal: He exhibits no edema.  Neurological: He is alert and oriented to person, place, and time.  Skin: Skin is warm and dry.  Psychiatric: He has a normal mood and affect. His behavior is normal. Thought content normal.          Assessment & Plan:  20 minutes spent with pt today. >50% of this time was spent counseling patient on anxiety, gerd diet, smoking cessation.

## 2016-07-03 ENCOUNTER — Other Ambulatory Visit: Payer: Self-pay

## 2016-07-03 MED ORDER — PANTOPRAZOLE SODIUM 40 MG PO TBEC: 40.0000 mg | DELAYED_RELEASE_TABLET | Freq: Two times a day (BID) | ORAL | 0 refills | Status: DC

## 2016-07-06 ENCOUNTER — Encounter: Payer: Self-pay | Admitting: Family

## 2016-07-06 DIAGNOSIS — M9901 Segmental and somatic dysfunction of cervical region: Secondary | ICD-10-CM | POA: Diagnosis not present

## 2016-07-06 DIAGNOSIS — M531 Cervicobrachial syndrome: Secondary | ICD-10-CM | POA: Diagnosis not present

## 2016-07-06 DIAGNOSIS — M9902 Segmental and somatic dysfunction of thoracic region: Secondary | ICD-10-CM | POA: Diagnosis not present

## 2016-07-24 DIAGNOSIS — M9901 Segmental and somatic dysfunction of cervical region: Secondary | ICD-10-CM | POA: Diagnosis not present

## 2016-07-24 DIAGNOSIS — M531 Cervicobrachial syndrome: Secondary | ICD-10-CM | POA: Diagnosis not present

## 2016-07-24 DIAGNOSIS — M9902 Segmental and somatic dysfunction of thoracic region: Secondary | ICD-10-CM | POA: Diagnosis not present

## 2016-07-31 DIAGNOSIS — M9901 Segmental and somatic dysfunction of cervical region: Secondary | ICD-10-CM | POA: Diagnosis not present

## 2016-07-31 DIAGNOSIS — M531 Cervicobrachial syndrome: Secondary | ICD-10-CM | POA: Diagnosis not present

## 2016-07-31 DIAGNOSIS — M9902 Segmental and somatic dysfunction of thoracic region: Secondary | ICD-10-CM | POA: Diagnosis not present

## 2016-08-14 ENCOUNTER — Encounter: Payer: Self-pay | Admitting: Family

## 2016-08-14 ENCOUNTER — Other Ambulatory Visit: Payer: Self-pay | Admitting: Family

## 2016-08-14 DIAGNOSIS — M9902 Segmental and somatic dysfunction of thoracic region: Secondary | ICD-10-CM | POA: Diagnosis not present

## 2016-08-14 DIAGNOSIS — M9901 Segmental and somatic dysfunction of cervical region: Secondary | ICD-10-CM | POA: Diagnosis not present

## 2016-08-14 DIAGNOSIS — M531 Cervicobrachial syndrome: Secondary | ICD-10-CM | POA: Diagnosis not present

## 2016-08-14 MED ORDER — ESCITALOPRAM OXALATE 20 MG PO TABS: 20.0000 mg | ORAL_TABLET | Freq: Every day | ORAL | Status: DC

## 2016-08-14 MED ORDER — ESCITALOPRAM OXALATE 20 MG PO TABS: 20.0000 mg | ORAL_TABLET | Freq: Every day | ORAL | 0 refills | Status: DC

## 2016-08-14 NOTE — Telephone Encounter (Signed)
[name]-- please advise request? 

## 2016-08-22 DIAGNOSIS — M531 Cervicobrachial syndrome: Secondary | ICD-10-CM | POA: Diagnosis not present

## 2016-08-22 DIAGNOSIS — M9902 Segmental and somatic dysfunction of thoracic region: Secondary | ICD-10-CM | POA: Diagnosis not present

## 2016-08-22 DIAGNOSIS — M9901 Segmental and somatic dysfunction of cervical region: Secondary | ICD-10-CM | POA: Diagnosis not present

## 2016-08-29 DIAGNOSIS — M531 Cervicobrachial syndrome: Secondary | ICD-10-CM | POA: Diagnosis not present

## 2016-08-29 DIAGNOSIS — M9901 Segmental and somatic dysfunction of cervical region: Secondary | ICD-10-CM | POA: Diagnosis not present

## 2016-08-29 DIAGNOSIS — M9902 Segmental and somatic dysfunction of thoracic region: Secondary | ICD-10-CM | POA: Diagnosis not present

## 2016-08-30 ENCOUNTER — Ambulatory Visit (INDEPENDENT_AMBULATORY_CARE_PROVIDER_SITE_OTHER): Payer: 59 | Admitting: Family

## 2016-08-30 ENCOUNTER — Encounter: Payer: Self-pay | Admitting: Family

## 2016-08-30 DIAGNOSIS — F341 Dysthymic disorder: Secondary | ICD-10-CM | POA: Diagnosis not present

## 2016-08-30 MED ORDER — ESCITALOPRAM OXALATE 20 MG PO TABS: 20.0000 mg | ORAL_TABLET | Freq: Every day | ORAL | 1 refills | Status: DC

## 2016-08-30 NOTE — Progress Notes (Signed)
Pre visit review using our clinic review tool, if applicable. No additional management support is needed unless otherwise documented below in the visit note. 

## 2016-08-30 NOTE — Assessment & Plan Note (Signed)
Stable. Continue current dose of lexapro. 15 minutes spent with patient.  >50% of this time was spent counseling patient on anxiety/depression. Advised him to keep an eye on his weight and let me know if he has weight gain. Advised him to establish with a therapist. Commended him on quitting smoking.

## 2016-08-30 NOTE — Progress Notes (Signed)
Subjective:    Patient ID: Jonathan Powell, male    DOB: 1966/04/25, 51 y.o.   MRN: [id number]  HPI  Jonathan Powell is a 51 yr old male who presents today for follow up of his anxiety and depression.  Last visit symptoms were not optimally controlled and we increased his lexapro form 10mg  to 20mg .  He then noted decreased libido and wished to come off of the lexapro because of libido issues and because he was feeling better. I advised him to cut dose back to 10 mg and to follow up in 1 month.   He reports that he dropped dose to 10mg  x 3 days. Symptoms of anxiety/depression returned so increased back up to 20mg .  Still having libido issues but is dealing with this. Did not establish with a therapist because he was feeling better. Quit smoking 7 weeks ago.   Wt Readings from Last 3 Encounters:  08/30/16 194 lb (88 kg)  06/14/16 195 lb 12.8 oz (88.8 kg)  05/26/16 184 lb (83.5 kg)    Review of Systems    see HPI  Past Medical History:  Diagnosis Date  . History of chicken pox   . History of kidney stones   . Pneumonia      Social History   Social History  . Marital status: Married    Spouse name: N/A  . Number of children: 2  . Years of education: N/A   Occupational History  . 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 Smoking status: Former Smoker    Packs/day: 1.00    Types: Cigarettes    Quit date: 07/12/2016  . Smokeless tobacco: Never Used  . Alcohol use 0.0 oz/week     Comment: social  . Drug use: No  . Sexual activity: Yes   Other Topics Concern  . Not on file   Social History Narrative   Occupation: vice Agricultural engineer, security company   Current Smoker-  2 PPD.   Alcohol use-yes,  very rare 1-2 drinks   Regular exercise-yes   Smoking Status:  current   Packs/Day:  1   Caffeine use/day:  3-4 daily   Does Patient Exercise:  Yes   Married- one biological son, one adopted son, step son       Past Surgical History:    Procedure Laterality Date  . CHOLECYSTECTOMY    . FOOT SURGERY Right 2001   toe surgery    Family History  Problem Relation Age of Onset  . Hyperlipidemia Mother   . Irritable bowel syndrome Mother   . Heart attack Mother   . Arthritis Maternal Grandmother   . Kidney failure Paternal Grandfather   . Alcohol abuse Paternal Grandfather     deceased  . ADD / ADHD Son     adopted    No Known Allergies  Current Outpatient Prescriptions on File Prior to Visit  Medication Sig Dispense Refill  . aspirin EC 81 MG tablet Take 1 tablet (81 mg total) by mouth daily.    Marland Kitchen escitalopram (LEXAPRO) 20 MG tablet Take 1 tablet (20 mg total) by mouth daily. 30 tablet 0  . pantoprazole (PROTONIX) 40 MG tablet Take 1 tablet (40 mg total) by mouth 2 (two) times daily. 180 tablet 0  . [DISCONTINUED] omeprazole (PRILOSEC) 20 MG capsule Take 1 capsule (20 mg total) by mouth daily. 30 capsule 0   No current facility-administered medications on file  prior to visit.     BP 116/68 (BP Location: Right Arm, Cuff Size: Normal)   Pulse 77   Temp 98.6 F (37 C) (Oral)   Resp 16   Ht 5\' 9"  (1.753 m)   Wt 194 lb (88 kg)   SpO2 98% Comment: room air  BMI 28.65 kg/m    Objective:   Physical Exam  Constitutional: He is oriented to person, place, and time. He appears well-developed and well-nourished. No distress.  Neurological: He is alert and oriented to person, place, and time.  Psychiatric: He has a normal mood and affect. His behavior is normal. Judgment and thought content normal.          Assessment & Plan:

## 2016-08-30 NOTE — Patient Instructions (Signed)
Continue lexapro  ?

## 2016-10-02 ENCOUNTER — Other Ambulatory Visit: Payer: Self-pay | Admitting: Physician Assistant

## 2016-10-02 IMAGING — US US ABDOMEN COMPLETE
1 series · 14 of 25 positions shown · non-contrast
Comparison: 05/26/2011

CLINICAL DATA: Elevated LFTs, history of prior cholecystectomy and
renal calculi

EXAM:
ABDOMEN ULTRASOUND COMPLETE

[Series 1: us abdomen complete · 0.22mm/px · 14 of 61 slices shown]
[im 1/61]
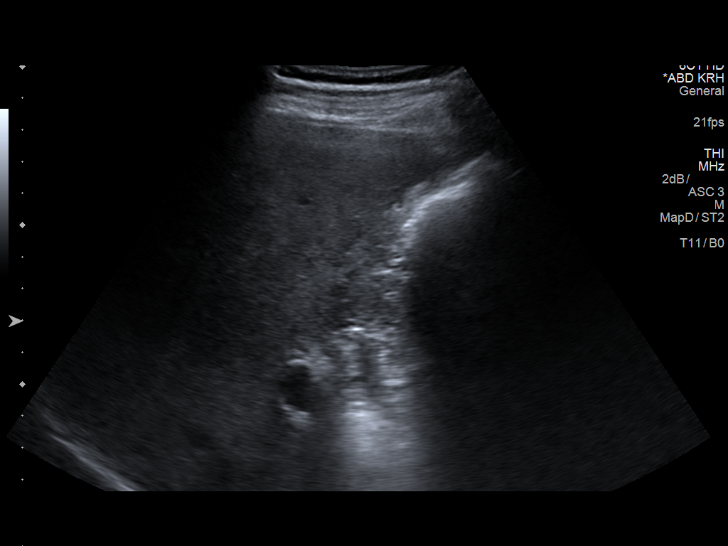
[im 6/61]
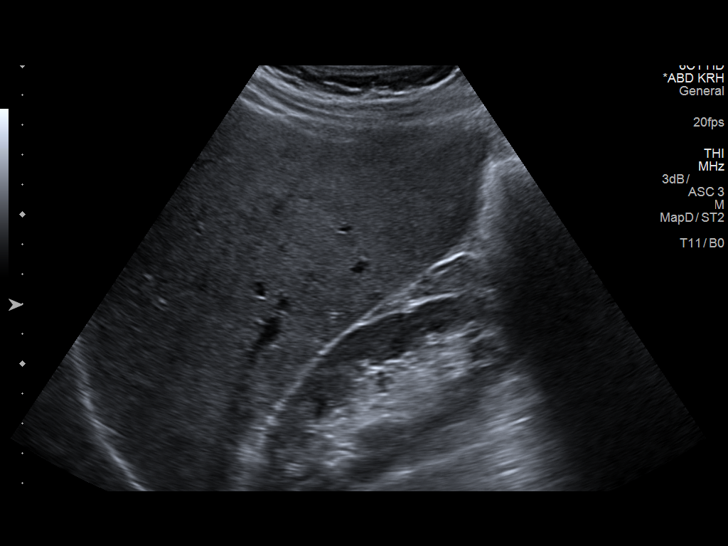
[im 11/61]
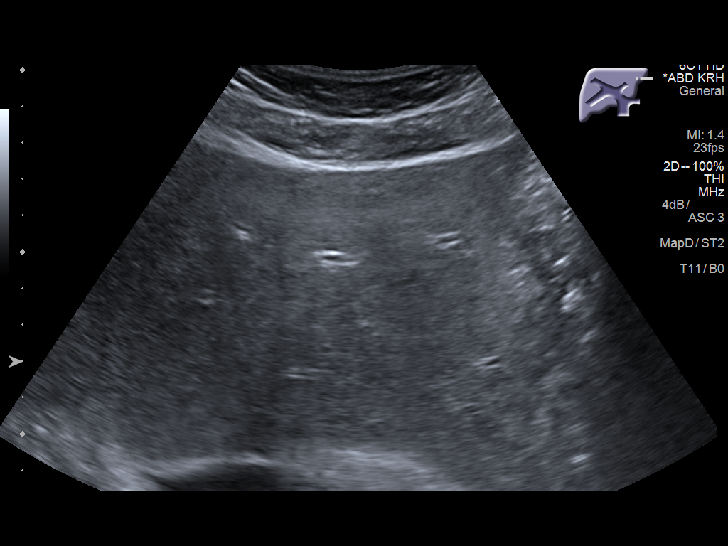
[im 16/61]
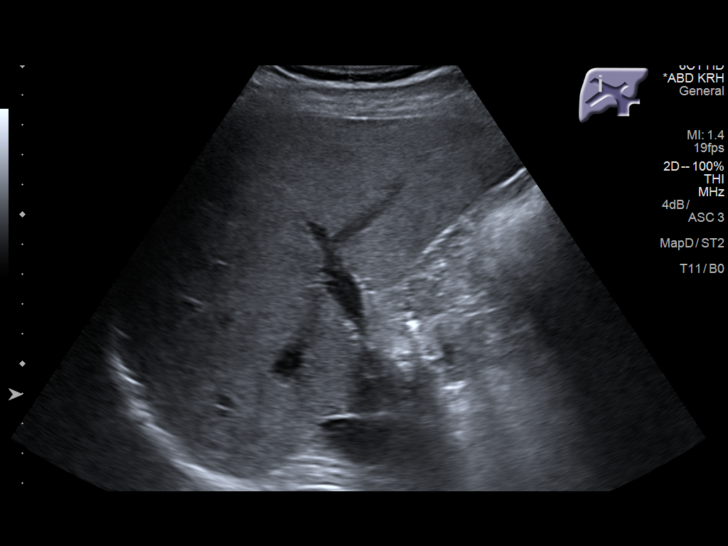
[im 21/61]
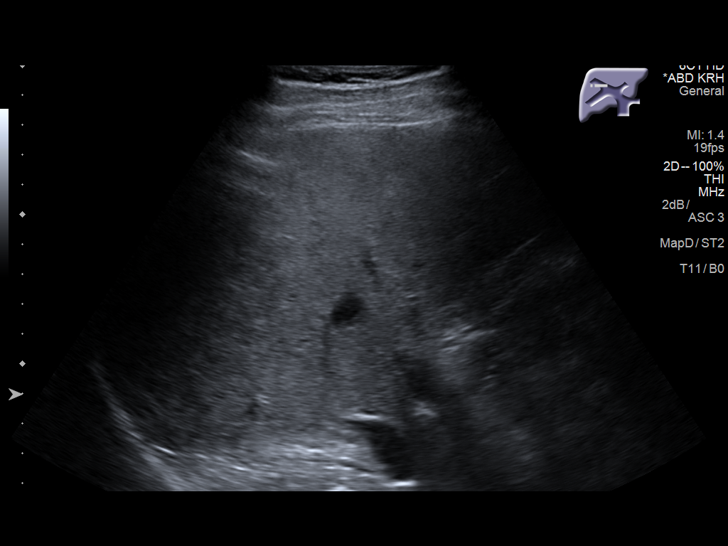
[im 23/61]
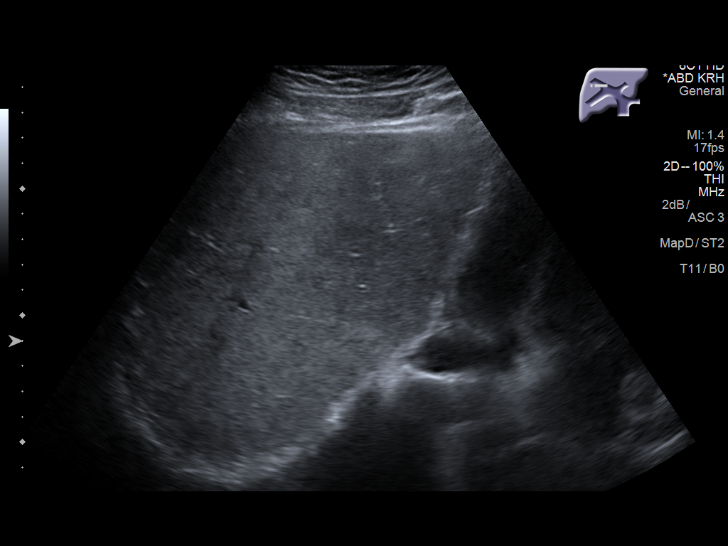
[im 28/61]
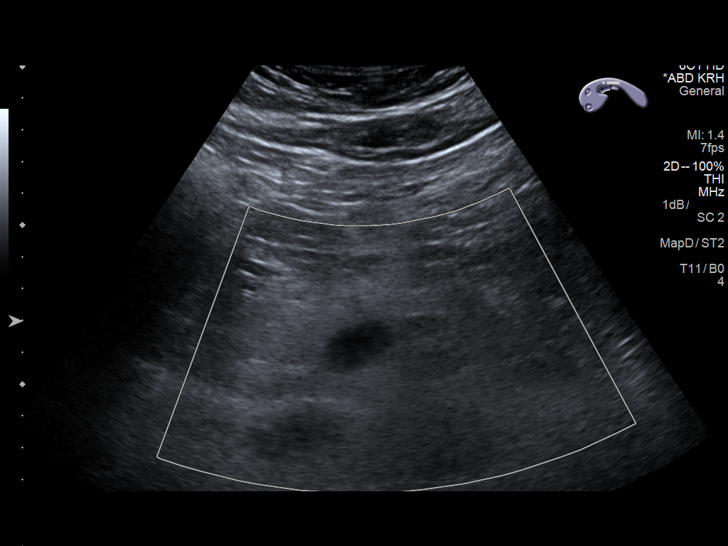
[im 33/61]
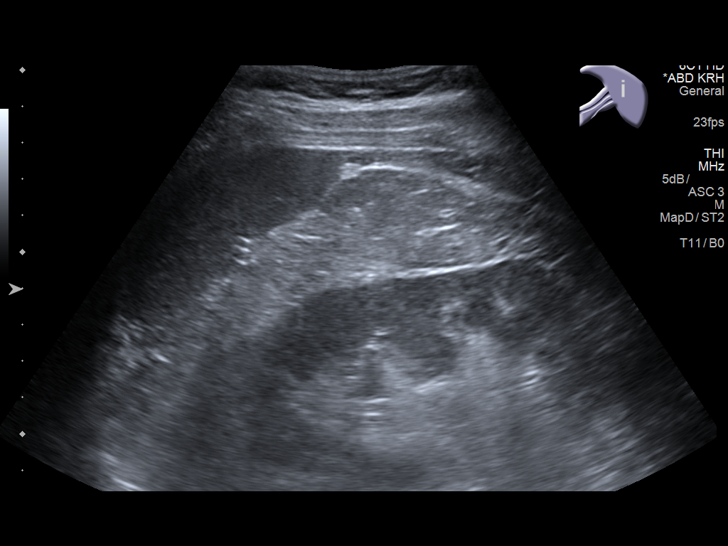
[im 38/61]
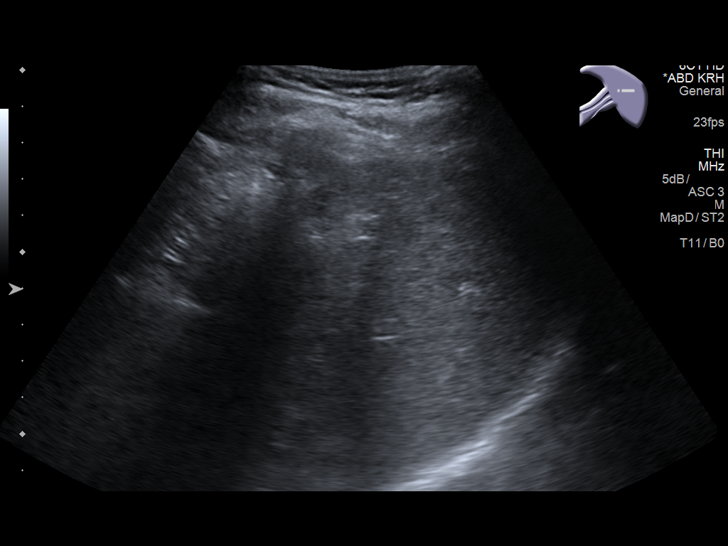
[im 41/61]
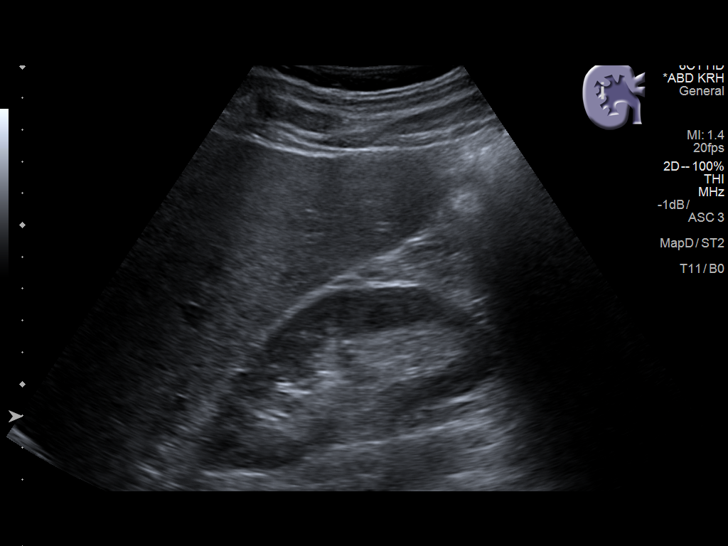
[im 46/61]
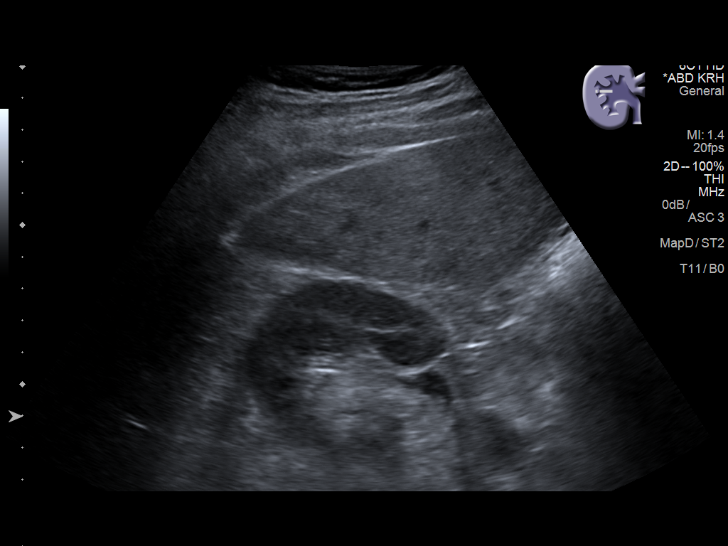
[im 51/61]
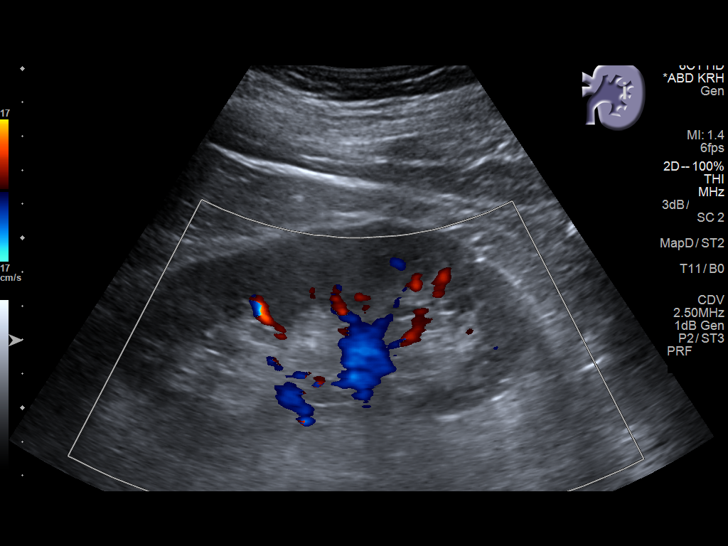
[im 56/61]
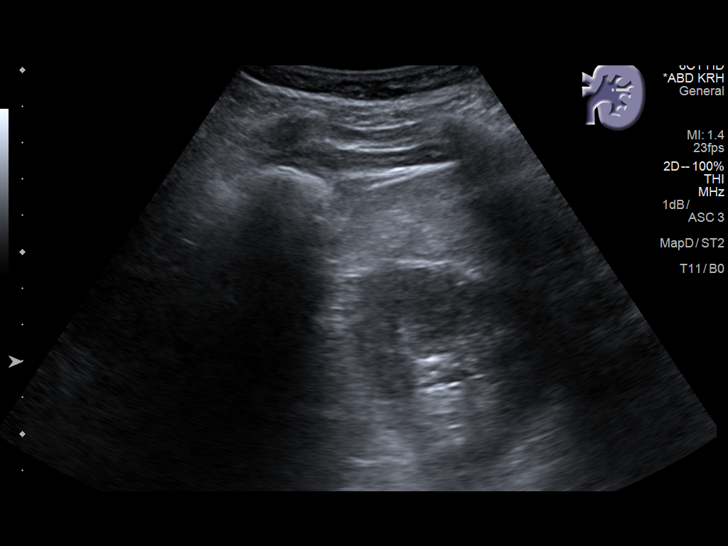
[im 61/61]
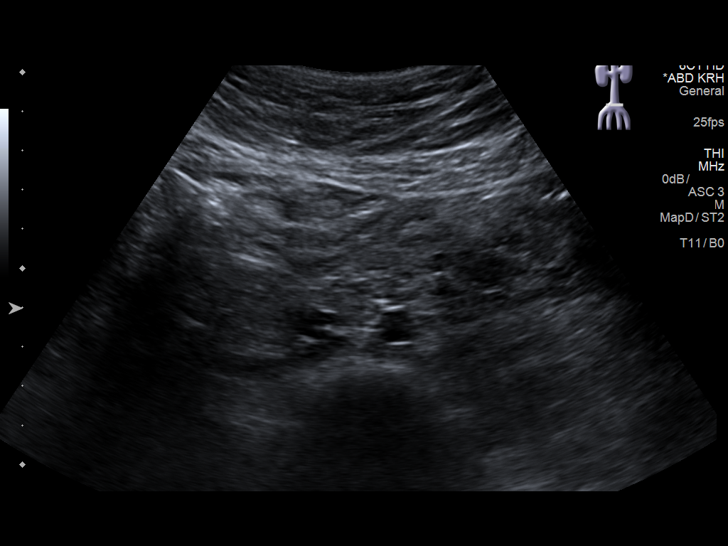

[14 of 25 positions shown; findings below may reference images not displayed]

FINDINGS: Gallbladder: Surgically absent

Common bile duct: Diameter: 3 mm

Liver: Mild increased echogenicity suggest hepatic steatosis. Patent
portal vein with normal hepatopetal flow. No focal hepatic
abnormality or intrahepatic biliary dilatation.

IVC: Limited visualization.  No gross abnormality.

Pancreas: Visualized portion unremarkable.

Spleen: Size and appearance within normal limits.

Right Kidney: Length: 11.6 cm. Echogenicity within normal limits. No
mass or hydronephrosis visualized.

Left Kidney: Length: 11.9 cm. Echogenicity within normal limits. No
mass or hydronephrosis visualized.

Abdominal aorta: No aneurysm visualized.

Other findings: None.
IMPRESSION: Prior cholecystectomy.

Mild hepatic steatosis

No acute process by ultrasound.

## 2016-10-18 ENCOUNTER — Encounter: Payer: Self-pay | Admitting: Family

## 2016-10-18 ENCOUNTER — Telehealth: Payer: Self-pay | Admitting: Family

## 2016-10-18 NOTE — Telephone Encounter (Signed)
Caller name: Joellen Jersey Relationship to patient: Assistant Can be reached: [phone] Pharmacy:  Reason for call: Patient is requesting to have the follow ing labs: Lipid Panel Vit D TSH T3/T4 PSA Thyroid Panel CBC/CMP Estradiol

## 2016-10-18 NOTE — Telephone Encounter (Signed)
Jonathan Powell-- please see phone note from today with specific labs being requested. It looks like he has scheduled an appt for 10/23/16 but is wanting to do labs only. Please advise?

## 2016-10-18 NOTE — Telephone Encounter (Signed)
Pt sent mychart message about this as well.  Please advise?

## 2016-10-19 ENCOUNTER — Telehealth: Payer: Self-pay | Admitting: Family

## 2016-10-19 DIAGNOSIS — R5383 Other fatigue: Secondary | ICD-10-CM

## 2016-10-19 NOTE — Telephone Encounter (Signed)
See mychart.  

## 2016-10-20 ENCOUNTER — Other Ambulatory Visit: Payer: 59

## 2016-10-23 ENCOUNTER — Other Ambulatory Visit: Payer: Self-pay | Admitting: Emergency Medicine

## 2016-10-23 ENCOUNTER — Other Ambulatory Visit (INDEPENDENT_AMBULATORY_CARE_PROVIDER_SITE_OTHER): Payer: 59

## 2016-10-23 ENCOUNTER — Ambulatory Visit: Payer: 59 | Admitting: Family

## 2016-10-23 DIAGNOSIS — R5383 Other fatigue: Secondary | ICD-10-CM

## 2016-10-23 LAB — CBC WITH DIFFERENTIAL/PLATELET
BASOS ABS: 0.1 10*3/uL (ref 0.0–0.1)
Basophils Relative: 0.8 % (ref 0.0–3.0)
EOS PCT: 0.9 % (ref 0.0–5.0)
Eosinophils Absolute: 0.1 10*3/uL (ref 0.0–0.7)
HCT: 46.1 % (ref 39.0–52.0)
HEMOGLOBIN: 15.1 g/dL (ref 13.0–17.0)
LYMPHS ABS: 2.1 10*3/uL (ref 0.7–4.0)
LYMPHS PCT: 26.3 % (ref 12.0–46.0)
MCHC: 32.7 g/dL (ref 30.0–36.0)
MCV: 92.2 fl (ref 78.0–100.0)
MONOS PCT: 7.7 % (ref 3.0–12.0)
Monocytes Absolute: 0.6 10*3/uL (ref 0.1–1.0)
NEUTROS PCT: 64.3 % (ref 43.0–77.0)
Neutro Abs: 5.2 10*3/uL (ref 1.4–7.7)
Platelets: 227 10*3/uL (ref 150.0–400.0)
RBC: 5 Mil/uL (ref 4.22–5.81)
RDW: 14.1 % (ref 11.5–15.5)
WBC: 8.1 10*3/uL (ref 4.0–10.5)

## 2016-10-23 LAB — HEPATIC FUNCTION PANEL
ALBUMIN: 4.3 g/dL (ref 3.5–5.2)
ALK PHOS: 118 U/L — AB (ref 39–117)
ALT: 39 U/L (ref 0–53)
AST: 24 U/L (ref 0–37)
Bilirubin, Direct: 0.1 mg/dL (ref 0.0–0.3)
Total Bilirubin: 0.5 mg/dL (ref 0.2–1.2)
Total Protein: 6.2 g/dL (ref 6.0–8.3)

## 2016-10-23 LAB — LIPID PANEL
Cholesterol: 210 mg/dL — ABNORMAL HIGH (ref 0–200)
HDL: 43.9 mg/dL (ref >39.00)
LDL CALC: 146 mg/dL — AB (ref 0–99)
NONHDL: 166.18
Total CHOL/HDL Ratio: 5
Triglycerides: 103 mg/dL (ref 0.0–149.0)
VLDL: 20.6 mg/dL (ref 0.0–40.0)

## 2016-10-23 LAB — T4, FREE: FREE T4: 0.7 ng/dL (ref 0.60–1.60)

## 2016-10-23 LAB — BASIC METABOLIC PANEL
BUN: 17 mg/dL (ref 6–23)
CALCIUM: 9.4 mg/dL (ref 8.4–10.5)
CO2: 31 meq/L (ref 19–32)
CREATININE: 0.93 mg/dL (ref 0.40–1.50)
Chloride: 104 mEq/L (ref 96–112)
GFR: 91.72 mL/min (ref >60.00)
GLUCOSE: 105 mg/dL — AB (ref 70–99)
Potassium: 4.3 mEq/L (ref 3.5–5.1)
SODIUM: 139 meq/L (ref 135–145)

## 2016-10-23 LAB — TSH: TSH: 0.99 u[IU]/mL (ref 0.35–4.50)

## 2016-10-23 LAB — T3, FREE: T3 FREE: 3.2 pg/mL (ref 2.3–4.2)

## 2016-10-23 LAB — PSA: PSA: 1.21 ng/mL (ref 0.10–4.00)

## 2016-10-23 LAB — ESTRADIOL: Estradiol: 21 pg/mL (ref <=39)

## 2016-10-24 LAB — TESTOSTERONE TOTAL,FREE,BIO, MALES
ALBUMIN: 4.4 g/dL (ref 3.6–5.1)
Sex Hormone Binding: 45 nmol/L (ref 10–50)
TESTOSTERONE BIOAVAILABLE: 99 ng/dL — AB (ref 110.0–575.0)
Testosterone, Free: 49.2 pg/mL (ref 46.0–224.0)
Testosterone: 477 ng/dL (ref 250–827)

## 2016-10-26 ENCOUNTER — Encounter: Payer: Self-pay | Admitting: Family

## 2016-10-26 LAB — VITAMIN D 1,25 DIHYDROXY
VITAMIN D 1, 25 (OH) TOTAL: 85 pg/mL — AB (ref 18–72)
VITAMIN D3 1, 25 (OH): 85 pg/mL
Vitamin D2 1, 25 (OH)2: <8 pg/mL

## 2017-01-01 ENCOUNTER — Other Ambulatory Visit: Payer: Self-pay | Admitting: Physician Assistant

## 2017-01-09 ENCOUNTER — Other Ambulatory Visit: Payer: Self-pay | Admitting: Physician Assistant

## 2017-02-28 ENCOUNTER — Ambulatory Visit: Payer: 59 | Admitting: Family

## 2017-03-10 ENCOUNTER — Other Ambulatory Visit: Payer: Self-pay | Admitting: Family

## 2017-03-14 ENCOUNTER — Ambulatory Visit: Payer: 59 | Admitting: Family

## 2017-03-14 ENCOUNTER — Encounter: Payer: Self-pay | Admitting: Family

## 2017-03-14 ENCOUNTER — Other Ambulatory Visit: Payer: Self-pay | Admitting: Family

## 2017-03-14 DIAGNOSIS — Z23 Encounter for immunization: Secondary | ICD-10-CM

## 2017-03-14 DIAGNOSIS — F341 Dysthymic disorder: Secondary | ICD-10-CM | POA: Diagnosis not present

## 2017-03-14 DIAGNOSIS — F17200 Nicotine dependence, unspecified, uncomplicated: Secondary | ICD-10-CM

## 2017-03-14 NOTE — Assessment & Plan Note (Signed)
Advised complete cessation of all tobacco products.
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
## 2017-03-14 NOTE — Assessment & Plan Note (Addendum)
Stable on current medications. Continue same.  15 minutes spent with pt today.  >50% of this time was spent counseling patient on anxiety/depression and tobacco abuse.

## 2017-03-19 DIAGNOSIS — J00 Acute nasopharyngitis [common cold]: Secondary | ICD-10-CM | POA: Diagnosis not present

## 2017-03-19 DIAGNOSIS — J020 Streptococcal pharyngitis: Secondary | ICD-10-CM | POA: Diagnosis not present

## 2017-03-19 DIAGNOSIS — Z9289 Personal history of other medical treatment: Secondary | ICD-10-CM | POA: Diagnosis not present

## 2017-06-05 DIAGNOSIS — M531 Cervicobrachial syndrome: Secondary | ICD-10-CM | POA: Diagnosis not present

## 2017-06-05 DIAGNOSIS — M9902 Segmental and somatic dysfunction of thoracic region: Secondary | ICD-10-CM | POA: Diagnosis not present

## 2017-06-05 DIAGNOSIS — M9901 Segmental and somatic dysfunction of cervical region: Secondary | ICD-10-CM | POA: Diagnosis not present

## 2017-06-15 ENCOUNTER — Encounter: Payer: 59 | Admitting: Family

## 2017-06-20 DIAGNOSIS — M531 Cervicobrachial syndrome: Secondary | ICD-10-CM | POA: Diagnosis not present

## 2017-06-20 DIAGNOSIS — M9902 Segmental and somatic dysfunction of thoracic region: Secondary | ICD-10-CM | POA: Diagnosis not present

## 2017-06-20 DIAGNOSIS — M9901 Segmental and somatic dysfunction of cervical region: Secondary | ICD-10-CM | POA: Diagnosis not present

## 2017-06-26 DIAGNOSIS — M531 Cervicobrachial syndrome: Secondary | ICD-10-CM | POA: Diagnosis not present

## 2017-06-26 DIAGNOSIS — M9902 Segmental and somatic dysfunction of thoracic region: Secondary | ICD-10-CM | POA: Diagnosis not present

## 2017-06-26 DIAGNOSIS — M9901 Segmental and somatic dysfunction of cervical region: Secondary | ICD-10-CM | POA: Diagnosis not present

## 2017-06-29 ENCOUNTER — Encounter: Payer: Self-pay | Admitting: Family

## 2017-06-29 ENCOUNTER — Ambulatory Visit (INDEPENDENT_AMBULATORY_CARE_PROVIDER_SITE_OTHER): Payer: 59 | Admitting: Family

## 2017-06-29 VITALS — BP 114/68 | HR 66 | Temp 98.5°F | Resp 16 | Ht 71.0 in | Wt 191.0 lb

## 2017-06-29 DIAGNOSIS — Z0000 Encounter for general adult medical examination without abnormal findings: Secondary | ICD-10-CM | POA: Diagnosis not present

## 2017-06-29 DIAGNOSIS — Z113 Encounter for screening for infections with a predominantly sexual mode of transmission: Secondary | ICD-10-CM

## 2017-06-29 NOTE — Progress Notes (Signed)
Subjective:    Patient ID: Jonathan Powell, male    DOB: [birth year], 52 y.o.   MRN: [id number]  HPI  Patient presents today for complete physical.  Immunizations: tdap 2016 Diet: healthy Exercise: regular Colonoscopy: due  Does report some issues with sleep. Wakes up intermittently through the night. Drinks caffeine- 1 cup coffee, coffee infused shake in AM.   Review of Systems  Constitutional: Negative for unexpected weight change.  HENT: Negative for hearing loss and rhinorrhea.   Eyes: Negative for visual disturbance.  Respiratory: Negative for cough and shortness of breath.   Cardiovascular: Negative for leg swelling.  Gastrointestinal: Negative for constipation and diarrhea.  Genitourinary: Negative for dysuria and frequency.  Musculoskeletal: Negative for arthralgias and myalgias.  Skin: Negative for rash.  Neurological:       Rare headaches  Hematological: Negative for adenopathy.   Past Medical History:  Diagnosis Date  . History of chicken pox   . History of kidney stones   . Pneumonia      Social History   Socioeconomic History  . Marital status: Married    Spouse name: Not on file  . Number of children: 2  . Years of education: Not on file  . Highest education level: Not on file  Social Needs  . Financial resource strain: Not on file  . Food insecurity - worry: Not on file  . Food insecurity - inability: Not on file  . Transportation needs - medical: Not on file  . Transportation needs - non-medical: Not on file  Occupational History  . Occupation: VP Scientist, clinical (histocompatibility and immunogenetics): [employer]    Comment: self employed  Tobacco Use  . Smoking status: Former Smoker    Packs/day: 1.00    Types: Cigarettes    Last attempt to quit: 07/12/2016    Years since quitting: 0.9  . Smokeless tobacco: Never Used  Substance and Sexual Activity  . Alcohol use: Yes    Alcohol/week: 0.0 oz    Comment: social  . Drug use: No  . Sexual activity: Yes  Other  Topics Concern  . Not on file  Social History Narrative   Occupation: vice Agricultural engineer, security company   Current Smoker-  2 PPD.   Alcohol use-yes,  very rare 1-2 drinks   Regular exercise-yes   Smoking Status:  current   Packs/Day:  1   Caffeine use/day:  3-4 daily   Does Patient Exercise:  Yes   Married- one biological son, one adopted son, step son    Past Surgical History:  Procedure Laterality Date  . CHOLECYSTECTOMY    . FOOT SURGERY Right 2001   toe surgery    Family History  Problem Relation Age of Onset  . Hyperlipidemia Mother   . Irritable bowel syndrome Mother   . Heart attack Mother   . Arthritis Maternal Grandmother   . Kidney failure Paternal Grandfather   . Alcohol abuse Paternal Grandfather        deceased  . ADD / ADHD Son        adopted  . Alcohol abuse Father   . Kidney Stones Father     No Known Allergies  Current Outpatient Medications on File Prior to Visit  Medication Sig Dispense Refill  . aspirin EC 81 MG tablet Take 1 tablet (81 mg total) by mouth daily.    Marland Kitchen escitalopram (LEXAPRO) 20 MG tablet TAKE 1 TABLET (20 MG TOTAL) BY MOUTH DAILY. 90 tablet 1  .  TESTOSTERONE IM Inject into the muscle. 1 injection twice a week    . [DISCONTINUED] omeprazole (PRILOSEC) 20 MG capsule Take 1 capsule (20 mg total) by mouth daily. 30 capsule 0   No current facility-administered medications on file prior to visit.     BP 114/68 (BP Location: Right Arm, Patient Position: Sitting, Cuff Size: Large)   Pulse 66   Temp 98.5 F (36.9 C) (Oral)   Resp 16   Ht 5\' 11"  (1.803 m)   Wt 191 lb (86.6 kg)   SpO2 100%   BMI 26.64 kg/m       Objective:   Physical Exam  Physical Exam  Constitutional: He is oriented to person, place, and time. He appears well-developed and well-nourished. No distress.  HENT:  Head: Normocephalic and atraumatic.  Right Ear: Tympanic membrane and ear canal normal.  Left Ear: Tympanic membrane and ear canal normal.    Mouth/Throat: Oropharynx is clear and moist.  Eyes: Pupils are equal, round, and reactive to light. No scleral icterus.  Neck: Normal range of motion. No thyromegaly present.  Cardiovascular: Normal rate and regular rhythm.   No murmur heard. Pulmonary/Chest: Effort normal and breath sounds normal. No respiratory distress. He has no wheezes. He has no rales. He exhibits no tenderness.  Abdominal: Soft. Bowel sounds are normal. He exhibits no distension and no mass. There is no tenderness. There is no rebound and no guarding.  Musculoskeletal: He exhibits no edema.  Lymphadenopathy:    He has no cervical adenopathy.  Neurological: He is alert and oriented to person, place, and time. He has normal patellar reflexes. He exhibits normal muscle tone. Coordination normal.  Skin: Skin is warm and dry.  Psychiatric: He has a normal mood and affect. His behavior is normal. Judgment and thought content normal.           Assessment & Plan:  Preventative care- refer for colo, discussed healthy diet, exercise.  Immunizations reviewed and up to date. Smokes cigars. Advised pt on complete cessation of all tobacco products.      Assessment & Plan:  EKG tracing is personally reviewed.  EKG notes NSR.  No acute changes.

## 2017-06-29 NOTE — Patient Instructions (Addendum)
You can try 5mg  of melatonin at bedtime as needed Continue healthy diet and regular exercise. Work on complete tobacco cessation. Complete lab work prior to leaving.  Cut lexapro in half and take 1/2 tab once daily for 1 month, then send me a note to let me know how you are feeling.

## 2017-06-30 LAB — HEPATIC FUNCTION PANEL
AG Ratio: 2.1 (calc) (ref 1.0–2.5)
ALT: 33 U/L (ref 9–46)
AST: 20 U/L (ref 10–40)
Albumin: 4.5 g/dL (ref 3.6–5.1)
Alkaline phosphatase (APISO): 112 U/L (ref 40–115)
BILIRUBIN TOTAL: 0.3 mg/dL (ref 0.2–1.2)
Bilirubin, Direct: 0.1 mg/dL (ref 0.0–0.2)
GLOBULIN: 2.1 g/dL (ref 1.9–3.7)
Indirect Bilirubin: 0.2 mg/dL (calc) (ref 0.2–1.2)
TOTAL PROTEIN: 6.6 g/dL (ref 6.1–8.1)

## 2017-06-30 LAB — CBC WITH DIFFERENTIAL/PLATELET
Basophils Absolute: 48 cells/uL (ref 0–200)
Basophils Relative: 0.5 %
Eosinophils Absolute: 48 cells/uL (ref 15–500)
Eosinophils Relative: 0.5 %
HCT: 45.5 % (ref 38.5–50.0)
Hemoglobin: 15.7 g/dL (ref 13.2–17.1)
Lymphs Abs: 2755 cells/uL (ref 850–3900)
MCH: 30.5 pg (ref 27.0–33.0)
MCHC: 34.5 g/dL (ref 32.0–36.0)
MCV: 88.5 fL (ref 80.0–100.0)
Monocytes Relative: 6.5 %
NEUTROS PCT: 63.8 %
Neutro Abs: 6125 cells/uL (ref 1500–7800)
RBC: 5.14 10*6/uL (ref 4.20–5.80)
RDW: 12.1 % (ref 11.0–15.0)
TOTAL LYMPHOCYTE: 28.7 %
WBC: 9.6 10*3/uL (ref 3.8–10.8)
WBCMIX: 624 {cells}/uL (ref 200–950)

## 2017-06-30 LAB — LIPID PANEL
CHOL/HDL RATIO: 5.9 (calc) — AB (ref <5.0)
Cholesterol: 270 mg/dL — ABNORMAL HIGH (ref <200)
HDL: 46 mg/dL (ref >40)
LDL CHOLESTEROL (CALC): 199 mg/dL — AB
NON-HDL CHOLESTEROL (CALC): 224 mg/dL — AB (ref <130)
TRIGLYCERIDES: 116 mg/dL (ref <150)

## 2017-06-30 LAB — BASIC METABOLIC PANEL
BUN / CREAT RATIO: 26 (calc) — AB (ref 6–22)
BUN: 29 mg/dL — AB (ref 7–25)
CHLORIDE: 103 mmol/L (ref 98–110)
CO2: 24 mmol/L (ref 20–32)
CREATININE: 1.13 mg/dL (ref 0.60–1.35)
Calcium: 9.5 mg/dL (ref 8.6–10.3)
Glucose, Bld: 92 mg/dL (ref 65–99)
POTASSIUM: 4.5 mmol/L (ref 3.5–5.3)
Sodium: 138 mmol/L (ref 135–146)

## 2017-06-30 LAB — URINALYSIS, ROUTINE W REFLEX MICROSCOPIC
Bilirubin Urine: NEGATIVE
Glucose, UA: NEGATIVE
Hgb urine dipstick: NEGATIVE
Ketones, ur: NEGATIVE
Leukocytes, UA: NEGATIVE
Nitrite: NEGATIVE
Protein, ur: NEGATIVE
SPECIFIC GRAVITY, URINE: 1.022 (ref 1.001–1.03)
pH: <=5 (ref 5.0–8.0)

## 2017-06-30 LAB — TSH: TSH: 0.52 mIU/L (ref 0.40–4.50)

## 2017-06-30 LAB — PSA: PSA: 1.1 ng/mL (ref <=4.0)

## 2017-07-01 ENCOUNTER — Telehealth: Payer: Self-pay | Admitting: Family

## 2017-07-01 DIAGNOSIS — Z113 Encounter for screening for infections with a predominantly sexual mode of transmission: Secondary | ICD-10-CM

## 2017-07-01 NOTE — Telephone Encounter (Signed)
Please ask lab if they can add on tests I have pended.

## 2017-07-01 NOTE — Telephone Encounter (Signed)
Please contact pt and let him know that cholesterol is very high.  Given how high it is and his family history of heart disease I would recommend that he add a statin once daily to decrease risk of heart attack and stroke.

## 2017-07-03 DIAGNOSIS — M531 Cervicobrachial syndrome: Secondary | ICD-10-CM | POA: Diagnosis not present

## 2017-07-03 DIAGNOSIS — M9902 Segmental and somatic dysfunction of thoracic region: Secondary | ICD-10-CM | POA: Diagnosis not present

## 2017-07-03 DIAGNOSIS — M9901 Segmental and somatic dysfunction of cervical region: Secondary | ICD-10-CM | POA: Diagnosis not present

## 2017-07-03 MED ORDER — ATORVASTATIN CALCIUM 20 MG PO TABS: 20.0000 mg | ORAL_TABLET | Freq: Every day | ORAL | 3 refills | Status: DC

## 2017-07-03 NOTE — Telephone Encounter (Signed)
Below results sent to pt via mychart. Awaiting response re: atorvastatin Rx.

## 2017-07-03 NOTE — Addendum Note (Signed)
Addended by: Kelle Darting A on: 07/03/2017 05:12 PM   Modules accepted: Orders

## 2017-07-05 ENCOUNTER — Other Ambulatory Visit (INDEPENDENT_AMBULATORY_CARE_PROVIDER_SITE_OTHER): Payer: 59

## 2017-07-05 DIAGNOSIS — Z113 Encounter for screening for infections with a predominantly sexual mode of transmission: Secondary | ICD-10-CM | POA: Diagnosis not present

## 2017-07-05 NOTE — Addendum Note (Signed)
Addended by: Caffie Pinto on: 07/05/2017 07:54 AM   Modules accepted: Orders

## 2017-07-06 LAB — RPR: RPR Ser Ql: NONREACTIVE

## 2017-07-06 LAB — HIV ANTIBODY (ROUTINE TESTING W REFLEX): HIV 1&2 Ab, 4th Generation: NONREACTIVE

## 2017-07-06 LAB — C. TRACHOMATIS/N. GONORRHOEAE RNA
C. trachomatis RNA, TMA: NOT DETECTED
N. gonorrhoeae RNA, TMA: NOT DETECTED

## 2017-07-06 LAB — HEPATITIS B SURFACE ANTIGEN: Hepatitis B Surface Ag: NONREACTIVE

## 2017-07-06 LAB — HSV 2 ANTIBODY, IGG

## 2017-07-11 DIAGNOSIS — M9902 Segmental and somatic dysfunction of thoracic region: Secondary | ICD-10-CM | POA: Diagnosis not present

## 2017-07-11 DIAGNOSIS — M531 Cervicobrachial syndrome: Secondary | ICD-10-CM | POA: Diagnosis not present

## 2017-07-11 DIAGNOSIS — M9901 Segmental and somatic dysfunction of cervical region: Secondary | ICD-10-CM | POA: Diagnosis not present

## 2017-07-18 DIAGNOSIS — M9901 Segmental and somatic dysfunction of cervical region: Secondary | ICD-10-CM | POA: Diagnosis not present

## 2017-07-18 DIAGNOSIS — M531 Cervicobrachial syndrome: Secondary | ICD-10-CM | POA: Diagnosis not present

## 2017-07-18 DIAGNOSIS — M9902 Segmental and somatic dysfunction of thoracic region: Secondary | ICD-10-CM | POA: Diagnosis not present

## 2017-07-24 ENCOUNTER — Encounter: Payer: Self-pay | Admitting: Physician Assistant

## 2017-07-24 ENCOUNTER — Ambulatory Visit: Payer: 59 | Admitting: Physician Assistant

## 2017-07-24 VITALS — BP 110/76 | HR 84 | Ht 69.0 in | Wt 194.0 lb

## 2017-07-24 DIAGNOSIS — M25512 Pain in left shoulder: Secondary | ICD-10-CM | POA: Diagnosis not present

## 2017-07-24 DIAGNOSIS — Z1211 Encounter for screening for malignant neoplasm of colon: Secondary | ICD-10-CM | POA: Diagnosis not present

## 2017-07-24 DIAGNOSIS — K219 Gastro-esophageal reflux disease without esophagitis: Secondary | ICD-10-CM

## 2017-07-24 MED ORDER — PEG 3350-KCL-NABCB-NACL-NASULF 236 G PO SOLR: 4000.0000 mL | Freq: Once | ORAL | 0 refills | Status: AC

## 2017-07-24 MED ORDER — PANTOPRAZOLE SODIUM 40 MG PO TBEC: 40.0000 mg | DELAYED_RELEASE_TABLET | Freq: Two times a day (BID) | ORAL | 11 refills | Status: DC

## 2017-07-24 NOTE — Progress Notes (Signed)
I agree with the above note, plan 

## 2017-07-24 NOTE — Progress Notes (Signed)
Subjective:    Patient ID: Jonathan Powell, male    DOB: 07-18-1965, 52 y.o.   MRN: [id number]  HPI "Jonathan Powell" is a pleasant 52 year old white male referred today by Jonathan Powell for screening colonoscopy, and also to discuss EGD. Patient is generally in good health with history of chronic GERD, fatty liver, anxiety and depression and is status post cholecystectomy. He was seen once in consultation here in 2017 by myself and established with Dr. Ardis Powell. At that time he was seen for symptoms of chronic GERD. EGD was advised for chronic symptoms and to rule out Barrett's. He canceled the procedure appointment. He says he has had ongoing problems with heartburn and indigestion for several years. He had been on omeprazole 20 mg once daily which she says did not help at all. Over the past year or so he has been on pantoprazole40 mg and periodically will require this twice daily to control his symptoms. He denies any dysphagia or odynophagia. He does have frequent heartburn and indigestion which typically bothers him more in the evening hours. He follows a zone  diet but is not on a specific antireflux diet.area He says he quit smoking about a year ago but still smokes cigars. He denies any lower GI symptoms, specifically no problems with constipation diarrhea or rectal bleeding. Family history is negative for colon cancer and polyps as far as he is aware.  Review of Systems Pertinent positive and negative review of systems were noted in the above HPI section.  All other review of systems was otherwise negative.  Outpatient Encounter Medications as of 07/24/2017  Medication Sig  . escitalopram (LEXAPRO) 10 MG tablet Take 10 mg by mouth daily.  Marland Kitchen aspirin EC 81 MG tablet Take 1 tablet (81 mg total) by mouth daily.  Marland Kitchen atorvastatin (LIPITOR) 20 MG tablet Take 1 tablet (20 mg total) by mouth daily.  . pantoprazole (PROTONIX) 40 MG tablet Take 1 tablet (40 mg total) by mouth 2 (two) times daily.  .  polyethylene glycol (GOLYTELY) 236 g solution Take 4,000 mLs by mouth once for 1 dose.  . [DISCONTINUED] escitalopram (LEXAPRO) 20 MG tablet TAKE 1 TABLET (20 MG TOTAL) BY MOUTH DAILY.  . [DISCONTINUED] omeprazole (PRILOSEC) 20 MG capsule Take 1 capsule (20 mg total) by mouth daily.  . [DISCONTINUED] TESTOSTERONE IM Inject into the muscle. 1 injection twice a week   No facility-administered encounter medications on file as of 07/24/2017.    No Known Allergies Patient Active Problem List   Diagnosis Date Noted  . Hyperlipidemia 05/12/2016  . Fatty liver 07/05/2015  . Preventative health care 12/07/2014  . GERD (gastroesophageal reflux disease) 10/27/2014  . Elevated LFTs 04/26/2011  . Skin lesion of face 04/26/2011  . ANXIETY DEPRESSION 11/24/2009  . TOBACCO ABUSE 11/24/2009   Social History   Socioeconomic History  . Marital status: Married    Spouse name: Not on file  . Number of children: 2  . Years of education: Not on file  . Highest education level: Not on file  Social Needs  . Financial resource strain: Not on file  . Food insecurity - worry: Not on file  . Food insecurity - inability: Not on file  . Transportation needs - medical: Not on file  . Transportation needs - non-medical: Not on file  Occupational History  . Occupation: VP Scientist, clinical (histocompatibility and immunogenetics): [employer]    Comment: self employed  Tobacco Use  . Smoking status: Former Smoker  Packs/day: 1.00    Types: Cigarettes    Last attempt to quit: 07/12/2016    Years since quitting: 1.0  . Smokeless tobacco: Never Used  Substance and Sexual Activity  . Alcohol use: Yes    Alcohol/week: 0.0 oz    Comment: social  . Drug use: No  . Sexual activity: Yes  Other Topics Concern  . Not on file  Social History Narrative   Occupation: vice Agricultural engineer, security company   Current Smoker-  2 PPD.   Alcohol use-yes,  very rare 1-2 drinks   Regular exercise-yes   Smoking Status:  current   Packs/Day:  1    Caffeine use/day:  3-4 daily   Does Patient Exercise:  Yes   Married- one biological son, one adopted son, step son    Mr. Krist family history includes ADD / ADHD in his son; Alcohol abuse in his father and paternal grandfather; Arthritis in his maternal grandmother; Heart attack in his mother; Hyperlipidemia in his mother; Irritable bowel syndrome in his mother; Kidney Stones in his father; Kidney failure in his paternal grandfather.      Objective:    Vitals:   07/24/17 1025  BP: 110/76  Pulse: 84    Physical Exam  Well-developed white male in no acute distress, pleasant pressure 110/76 pulse 84, height 5 foot 9, weight 194, BMI 28.6. HEENT;nontraumatic normocephalic EOMI PERRLA sclera anicteric, Cardiovascular; regular rate and rhythm with S1-S2 no murmur or gallop, Pulmonary; clear bilaterally, Abdomen ;soft, nontender nondistended bowel sounds are active there is no palpable mass or hepatosplenomegaly, Neuropsych; mood and affect appropriate       Assessment & Plan:   #72 52 year old white male with chronic GERD, requiring chronic PPI therapy over the past 4-5 years at least.. Rule out Barrett's esophagus Symptoms frequently refractory to once daily Protonix and controlled with twice a day Protonix 40 mg 32 colon cancer screening-average risk and asymptomatic #3 status post cholecystectomy #4 anxiety/depression #5 history of fatty liver  Plan; Reviewed a strict antireflux regimen including elevation of the head of the bed. He was provided with an antireflux diet Will refill Protonix 40 mg before meals breakfast and before meals dinner daily, he will try to wean back to once daily as tolerated  Patient will be scheduled for colonoscopy and EGD with Dr. Ardis Powell. Both procedures were discussed in detail including indications risks and benefits, and he is agreeable to proceed  Jonathan Ferguson PA-C 07/24/2017   Cc: Jonathan Alar, NP

## 2017-07-24 NOTE — Patient Instructions (Addendum)
Normal BMI (Body Mass Index- based on height and weight) is between 19 and 25. Your BMI today is Body mass index is 28.65 kg/m. Marland Kitchen Please consider follow up  regarding your BMI with your Primary Care Provider.   We have sent the following medications to your pharmacy for you to pick up at your convenience:  You have been scheduled for a colonoscopy/EGD. Please follow written instructions given to you at your visit today.  Please pick up your prep supplies at the pharmacy within the next 1-3 days. If you use inhalers (even only as needed), please bring them with you on the day of your procedure. Your physician has requested that you go to www.startemmi.com and enter the access code given to you at your visit today. This web site gives a general overview about your procedure. However, you should still follow specific instructions given to you by our office regarding your preparation for the procedure.  You may have a light breakfast the morning of prep day (the day before the procedure).   You may choose from one of the following items: eggs and toast OR chicken noodle soup and crackers.   You should have your breakfast completed between 8:00 and 9:00 am the day before your procedure.    After you have had your light breakfast you should start a clear liquid diet only, NO SOLIDS. No additional solid food is allowed. You may continue to have clear liquid up to 3 hours prior to your procedure.

## 2017-07-25 DIAGNOSIS — M531 Cervicobrachial syndrome: Secondary | ICD-10-CM | POA: Diagnosis not present

## 2017-07-25 DIAGNOSIS — M9901 Segmental and somatic dysfunction of cervical region: Secondary | ICD-10-CM | POA: Diagnosis not present

## 2017-07-25 DIAGNOSIS — M9902 Segmental and somatic dysfunction of thoracic region: Secondary | ICD-10-CM | POA: Diagnosis not present

## 2017-07-30 DIAGNOSIS — M531 Cervicobrachial syndrome: Secondary | ICD-10-CM | POA: Diagnosis not present

## 2017-07-30 DIAGNOSIS — M9902 Segmental and somatic dysfunction of thoracic region: Secondary | ICD-10-CM | POA: Diagnosis not present

## 2017-07-30 DIAGNOSIS — M9901 Segmental and somatic dysfunction of cervical region: Secondary | ICD-10-CM | POA: Diagnosis not present

## 2017-08-08 DIAGNOSIS — M9902 Segmental and somatic dysfunction of thoracic region: Secondary | ICD-10-CM | POA: Diagnosis not present

## 2017-08-08 DIAGNOSIS — M9901 Segmental and somatic dysfunction of cervical region: Secondary | ICD-10-CM | POA: Diagnosis not present

## 2017-08-08 DIAGNOSIS — M531 Cervicobrachial syndrome: Secondary | ICD-10-CM | POA: Diagnosis not present

## 2017-08-15 DIAGNOSIS — M531 Cervicobrachial syndrome: Secondary | ICD-10-CM | POA: Diagnosis not present

## 2017-08-15 DIAGNOSIS — M9902 Segmental and somatic dysfunction of thoracic region: Secondary | ICD-10-CM | POA: Diagnosis not present

## 2017-08-15 DIAGNOSIS — M9901 Segmental and somatic dysfunction of cervical region: Secondary | ICD-10-CM | POA: Diagnosis not present

## 2017-08-26 ENCOUNTER — Other Ambulatory Visit: Payer: Self-pay | Admitting: Family

## 2017-08-31 ENCOUNTER — Encounter: Payer: Self-pay | Admitting: Gastroenterology

## 2017-09-12 ENCOUNTER — Telehealth: Payer: Self-pay | Admitting: Gastroenterology

## 2017-09-12 NOTE — Telephone Encounter (Signed)
Dr Ardis Hughs do you want to bill?

## 2017-09-13 NOTE — Telephone Encounter (Signed)
Yes, per policy.

## 2017-09-14 ENCOUNTER — Encounter: Payer: 59 | Admitting: Gastroenterology

## 2017-09-24 ENCOUNTER — Encounter: Payer: 59 | Admitting: Gastroenterology

## 2017-09-28 ENCOUNTER — Ambulatory Visit: Payer: 59 | Admitting: Family

## 2017-09-28 ENCOUNTER — Encounter: Payer: Self-pay | Admitting: Family

## 2017-09-28 VITALS — BP 111/73 | HR 72 | Temp 98.0°F | Resp 16 | Ht 71.0 in | Wt 181.2 lb

## 2017-09-28 DIAGNOSIS — K219 Gastro-esophageal reflux disease without esophagitis: Secondary | ICD-10-CM | POA: Diagnosis not present

## 2017-09-28 DIAGNOSIS — Z720 Tobacco use: Secondary | ICD-10-CM | POA: Diagnosis not present

## 2017-09-28 DIAGNOSIS — E785 Hyperlipidemia, unspecified: Secondary | ICD-10-CM

## 2017-09-28 DIAGNOSIS — F341 Dysthymic disorder: Secondary | ICD-10-CM

## 2017-09-28 LAB — LIPID PANEL
Cholesterol: 175 mg/dL (ref <200)
HDL: 47 mg/dL (ref >40)
LDL Cholesterol (Calc): 106 mg/dL (calc) — ABNORMAL HIGH
NON-HDL CHOLESTEROL (CALC): 128 mg/dL (ref <130)
Total CHOL/HDL Ratio: 3.7 (calc) (ref <5.0)
Triglycerides: 123 mg/dL (ref <150)

## 2017-09-28 NOTE — Patient Instructions (Signed)
Please complete lab work prior to leaving.   

## 2017-09-28 NOTE — Progress Notes (Signed)
Subjective:    Patient ID: Jonathan Powell, male    DOB: 06-29-1965, 52 y.o.   MRN: [id number]  HPI  Patient is a 52 yr old male who presents today for follow up of his anxiety. Continues lexapro 20mg .  He reports that he did try cutting down to 10 mg a day.  He did this for about a week and a half and noted that he did not feel as well.  He decided to go back up to the 20 mg dose and he is feeling well once again.    Hyperlipidemia- He has been working hard at [employer] and on his diet.  He started statin which he is tolerating without side effect. Lab Results  Component Value Date   CHOL 270 (H) 06/29/2017   HDL 46 06/29/2017   LDLCALC 199 (H) 06/29/2017   TRIG 116 06/29/2017   CHOLHDL 5.9 (H) 06/29/2017   Wt Readings from Last 3 Encounters:  09/28/17 181 lb 3.2 oz (82.2 kg)  07/24/17 194 lb (88 kg)  06/29/17 191 lb (86.6 kg)   GERD- notes that symptoms return if he stops protonix.  Takes once daily in the AM most days.       Review of Systems    see HPI  Past Medical History:  Diagnosis Date  . History of chicken pox   . History of kidney stones   . Pneumonia      Social History   Socioeconomic History  . Marital status: Married    Spouse name: Not on file  . Number of children: 2  . Years of education: Not on file  . Highest education level: Not on file  Occupational History  . Occupation: VP Scientist, clinical (histocompatibility and immunogenetics): [employer]    Comment: self employed  Social Needs  . Financial resource strain: Not on file  . Food insecurity:    Worry: Not on file    Inability: Not on file  . Transportation needs:    Medical: Not on file    Non-medical: Not on file  Tobacco Use  . Smoking status: Former Smoker    Packs/day: 1.00    Types: Cigarettes    Last attempt to quit: 07/12/2016    Years since quitting: 1.2  . Smokeless tobacco: Never Used  Substance and Sexual Activity  . Alcohol use: Yes    Alcohol/week: 0.0 oz    Comment: social  . Drug use: No  .  Sexual activity: Yes  Lifestyle  . Physical activity:    Days per week: Not on file    Minutes per session: Not on file  . Stress: Not on file  Relationships  . Social connections:    Talks on phone: Not on file    Gets together: Not on file    Attends religious service: Not on file    Active member of club or organization: Not on file    Attends meetings of clubs or organizations: Not on file    Relationship status: Not on file  . Intimate partner violence:    Fear of current or ex partner: Not on file    Emotionally abused: Not on file    Physically abused: Not on file    Forced sexual activity: Not on file  Other Topics Concern  . Not on file  Social History Narrative   Occupation: vice Agricultural engineer, security company   Current Smoker-  2 PPD.   Alcohol use-yes,  very rare  1-2 drinks   Regular exercise-yes   Smoking Status:  current   Packs/Day:  1   Caffeine use/day:  3-4 daily   Does Patient Exercise:  Yes   Married- one biological son, one adopted son, step son    Past Surgical History:  Procedure Laterality Date  . CHOLECYSTECTOMY    . FOOT SURGERY Right 2001   toe surgery    Family History  Problem Relation Age of Onset  . Hyperlipidemia Mother   . Irritable bowel syndrome Mother   . Heart attack Mother   . Arthritis Maternal Grandmother   . Kidney failure Paternal Grandfather   . Alcohol abuse Paternal Grandfather        deceased  . ADD / ADHD Son        adopted  . Alcohol abuse Father   . Kidney Stones Father     No Known Allergies  Current Outpatient Medications on File Prior to Visit  Medication Sig Dispense Refill  . aspirin EC 81 MG tablet Take 1 tablet (81 mg total) by mouth daily.    Marland Kitchen atorvastatin (LIPITOR) 20 MG tablet Take 1 tablet (20 mg total) by mouth daily. 90 tablet 3  . escitalopram (LEXAPRO) 20 MG tablet TAKE 1 TABLET (20 MG TOTAL) BY MOUTH DAILY. 90 tablet 1  . pantoprazole (PROTONIX) 40 MG tablet Take 1 tablet (40 mg total)  by mouth 2 (two) times daily. 60 tablet 11  . [DISCONTINUED] omeprazole (PRILOSEC) 20 MG capsule Take 1 capsule (20 mg total) by mouth daily. 30 capsule 0   No current facility-administered medications on file prior to visit.     BP 111/73 (BP Location: Right Arm, Patient Position: Sitting, Cuff Size: Small)   Pulse 72   Temp 98 F (36.7 C) (Oral)   Resp 16   Ht 5\' 11"  (1.803 m)   Wt 181 lb 3.2 oz (82.2 kg)   SpO2 98%   BMI 25.27 kg/m    Objective:   Physical Exam  Constitutional: He is oriented to person, place, and time. He appears well-developed and well-nourished. No distress.  HENT:  Head: Normocephalic and atraumatic.  Musculoskeletal: He exhibits no edema.  Neurological: He is alert and oriented to person, place, and time.  Skin: Skin is warm and dry.  Psychiatric: He has a normal mood and affect. His behavior is normal. Thought content normal.          Assessment & Plan:  Anxiety/depression-currently stable on Lexapro.  Continue same.  Tobacco abuse-he is no longer smoking cigarettes.  He reports he smokes about 2 cigars a day.  We discussed quitting cigar smoking as well.  Hyperlipidemia-tolerating statin obtain follow-up lipid panel.  GERD-stable on proton pump inhibitor.  He has been able to decrease in the frequency a bit since he has lost some weight has been watching his diet.  He has an endoscopy/colonoscopy scheduled.

## 2017-09-29 ENCOUNTER — Encounter: Payer: Self-pay | Admitting: Family

## 2017-11-25 DIAGNOSIS — L03116 Cellulitis of left lower limb: Secondary | ICD-10-CM | POA: Diagnosis not present

## 2017-12-24 ENCOUNTER — Encounter: Payer: Self-pay | Admitting: Family

## 2017-12-26 ENCOUNTER — Ambulatory Visit: Payer: 59 | Admitting: Medical

## 2017-12-26 ENCOUNTER — Encounter: Payer: Self-pay | Admitting: Medical

## 2017-12-26 VITALS — BP 112/63 | HR 77 | Temp 98.5°F | Resp 16 | Ht 71.0 in | Wt 183.4 lb

## 2017-12-26 DIAGNOSIS — F341 Dysthymic disorder: Secondary | ICD-10-CM | POA: Diagnosis not present

## 2017-12-26 DIAGNOSIS — R55 Syncope and collapse: Secondary | ICD-10-CM

## 2017-12-26 DIAGNOSIS — F17200 Nicotine dependence, unspecified, uncomplicated: Secondary | ICD-10-CM

## 2017-12-26 DIAGNOSIS — R5383 Other fatigue: Secondary | ICD-10-CM | POA: Diagnosis not present

## 2017-12-26 MED ORDER — HYDROXYZINE HCL 10 MG PO TABS: ORAL_TABLET | ORAL | 0 refills | Status: DC

## 2017-12-26 NOTE — Progress Notes (Addendum)
Subjective:    Patient ID: Jonathan Powell, male    DOB: 06/20/1965, 52 y.o.   MRN: [id number]  HPI  Pt in for evaluation.  He states on Saturday he was working out. He was snatching 125 lb weight. He did not lift the weight completely. He states he strained a lot during the attempt to lift that weight. He works out 1.5 hour a day. He does smoke but accustomed to working out. He states he sweats a lot when he works out a lot.But stays well hydrated.  At time after lift he had slight blurred vision briefly then resolved.   No ha at any time or after near syncope. No nausea or vomiting.   He feels slight feverish. But no infectious signs or symptoms reported.  No chest pain or palpitation at time of near syncope. But then he questions if maybe had palpation.  He states he hydrate well and eats healthy diet.  He has been working out since then daily. No further near syncope events.   No known history of aneurysm in family. Pt not reporting any headache.  Pt anxiety has been controlled up until near syncope. Some increase since day of but since then anxiety is diminishing some each day.. Prior transient use xanax in the panic but not recently.   Review of Systems  Constitutional: Negative for chills, fatigue and fever.  HENT: Negative for congestion and drooling.   Respiratory: Negative for cough, chest tightness, shortness of breath and wheezing.   Cardiovascular: Negative for chest pain and palpitations.  Musculoskeletal: Negative for back pain.  Skin: Negative for rash.  Neurological: Positive for dizziness. Negative for speech difficulty and weakness.       Near syncope at time of working out.  Transient blurred vision on day of near syncope.  Hematological: Negative for adenopathy. Does not bruise/bleed easily.  Psychiatric/Behavioral: Negative for behavioral problems, dysphoric mood, hallucinations, sleep disturbance and suicidal ideas. The patient is not  nervous/anxious.    Past Medical History:  Diagnosis Date  . History of chicken pox   . History of kidney stones   . Pneumonia      Social History   Socioeconomic History  . Marital status: Married    Spouse name: Not on file  . Number of children: 2  . Years of education: Not on file  . Highest education level: Not on file  Occupational History  . Occupation: VP Scientist, clinical (histocompatibility and immunogenetics): [employer]    Comment: self employed  Social Needs  . Financial resource strain: Not on file  . Food insecurity:    Worry: Not on file    Inability: Not on file  . Transportation needs:    Medical: Not on file    Non-medical: Not on file  Tobacco Use  . Smoking status: Former Smoker    Packs/day: 1.00    Types: Cigarettes    Last attempt to quit: 07/12/2016    Years since quitting: 1.4  . Smokeless tobacco: Never Used  Substance and Sexual Activity  . Alcohol use: Yes    Alcohol/week: 0.0 standard drinks    Comment: social  . Drug use: No  . Sexual activity: Yes  Lifestyle  . Physical activity:    Days per week: Not on file    Minutes per session: Not on file  . Stress: Not on file  Relationships  . Social connections:    Talks on phone: Not on file  Gets together: Not on file    Attends religious service: Not on file    Active member of club or organization: Not on file    Attends meetings of clubs or organizations: Not on file    Relationship status: Not on file  . Intimate partner violence:    Fear of current or ex partner: Not on file    Emotionally abused: Not on file    Physically abused: Not on file    Forced sexual activity: Not on file  Other Topics Concern  . Not on file  Social History Narrative   Occupation: vice Agricultural engineer, security company   Current Smoker-  2 PPD.   Alcohol use-yes,  very rare 1-2 drinks   Regular exercise-yes   Smoking Status:  current   Packs/Day:  1   Caffeine use/day:  3-4 daily   Does Patient Exercise:  Yes   Married- one  biological son, one adopted son, step son    Past Surgical History:  Procedure Laterality Date  . CHOLECYSTECTOMY    . FOOT SURGERY Right 2001   toe surgery    Family History  Problem Relation Age of Onset  . Hyperlipidemia Mother   . Irritable bowel syndrome Mother   . Heart attack Mother   . Arthritis Maternal Grandmother   . Kidney failure Paternal Grandfather   . Alcohol abuse Paternal Grandfather        deceased  . ADD / ADHD Son        adopted  . Alcohol abuse Father   . Kidney Stones Father     No Known Allergies  Current Outpatient Medications on File Prior to Visit  Medication Sig Dispense Refill  . aspirin EC 81 MG tablet Take 1 tablet (81 mg total) by mouth daily.    Marland Kitchen atorvastatin (LIPITOR) 20 MG tablet Take 1 tablet (20 mg total) by mouth daily. 90 tablet 3  . escitalopram (LEXAPRO) 20 MG tablet TAKE 1 TABLET (20 MG TOTAL) BY MOUTH DAILY. 90 tablet 1  . pantoprazole (PROTONIX) 40 MG tablet Take 1 tablet (40 mg total) by mouth 2 (two) times daily. 60 tablet 11  . [DISCONTINUED] omeprazole (PRILOSEC) 20 MG capsule Take 1 capsule (20 mg total) by mouth daily. 30 capsule 0   No current facility-administered medications on file prior to visit.     BP 103/67   Pulse 77   Temp 98.5 F (36.9 C) (Oral)   Resp 16   Ht 5\' 11"  (1.803 m)   Wt 183 lb 6.4 oz (83.2 kg)   SpO2 99%   BMI 25.58 kg/m       Objective:   Physical Exam  General Mental Status- Alert. General Appearance- Not in acute distress.   Skin General: Color- Normal Color. Moisture- Normal Moisture.  Neck Carotid Arteries- Normal color. Moisture- Normal Moisture. No carotid bruits. No JVD.  Chest and Lung Exam Auscultation: Breath Sounds:-Normal.  Cardiovascular Auscultation:Rythm- Regular. Murmurs & Other Heart Sounds:Auscultation of the heart reveals- No Murmurs.  Abdomen Inspection:-Inspeection Normal. Palpation/Percussion:Note:No mass. Palpation and Percussion of the abdomen  reveal- Non Tender, Non Distended + BS, no rebound or guarding.  Neurologic Cranial Nerve exam:- CN III-XII intact(No nystagmus), symmetric smile. Drift Test:- No drift. Romberg Exam:- Negative.  Heal to Toe Gait exam:-Normal. Finger to Nose:- Normal/Intact Strength:- 5/5 equal and symmetric strength both upper and lower extremities.      Assessment & Plan:  Your near passing out event described other day while straining/weightlifting  appears to be possible near vasovagal syncope type event.  On review of your EKG today it does show sinus rhythm.  EKG looks very similar to prior EKG in January 2018.  No significant abnormality seen on review.  Your neurologic exam was completely normal today.  I do want to get blood work to include CBC and a metabolic panel.  Continue to make sure that you are well-hydrated before you work out.  Since you do report some anxiety increase since this near passing out that, I decided to make medication called hydroxyzine available.  You can take 1 to 2 tablets every 8 hours as needed if you feel more anxious.  Common side effect of this is sedation.  Consider possibility of imaging studies today but based on the fact that your symptoms are improving and you had completely normal neurologic exam decided against imaging studies.  If you do have any near-syncope type event or any passing out event then would recommend that you be evaluated at  ED at that time.  Follow-up in 7 to 10 days or as needed.  Mackie Pai, PA-C

## 2017-12-26 NOTE — Patient Instructions (Addendum)
Your near passing out event described other day while straining/weightlifting appears to be possible near vasovagal syncope event.  On review of your EKG today it does show sinus rhythm.  EKG looks very similar to prior EKG in January 2018.  No significant abnormality seen on review.  Your neurologic exam was completely normal today.  I do want to get blood work to include CBC and a metabolic panel.  Continue to make sure that you are well-hydrated before you work out.  Since you do report some anxiety increase since this near passing out that, I decided to make medication called hydroxyzine available.  You can take 1 to 2 tablets every 8 hours as needed if you feel more anxious.  Common side effect of this is sedation.  Consider possibility of imaging studies today but based on the fact that your symptoms are improving and you had completely normal neurologic exam decided against imaging studies.  If you do have any near-syncope type event or any passing out event then would recommend that you  be evaluated at ED at  that time.  Follow-up in 7 to 10 days or as needed.

## 2017-12-27 ENCOUNTER — Telehealth: Payer: Self-pay | Admitting: Medical

## 2017-12-27 ENCOUNTER — Encounter: Payer: Self-pay | Admitting: Medical

## 2017-12-27 DIAGNOSIS — R748 Abnormal levels of other serum enzymes: Secondary | ICD-10-CM

## 2017-12-27 LAB — CBC WITH DIFFERENTIAL/PLATELET
BASOS ABS: 0.1 10*3/uL (ref 0.0–0.1)
Basophils Relative: 1.2 % (ref 0.0–3.0)
EOS ABS: 0.1 10*3/uL (ref 0.0–0.7)
Eosinophils Relative: 1 % (ref 0.0–5.0)
HCT: 43.4 % (ref 39.0–52.0)
Hemoglobin: 14.4 g/dL (ref 13.0–17.0)
LYMPHS ABS: 2.2 10*3/uL (ref 0.7–4.0)
Lymphocytes Relative: 26.7 % (ref 12.0–46.0)
MCHC: 33.1 g/dL (ref 30.0–36.0)
MCV: 93.9 fl (ref 78.0–100.0)
MONOS PCT: 8.3 % (ref 3.0–12.0)
Monocytes Absolute: 0.7 10*3/uL (ref 0.1–1.0)
NEUTROS PCT: 62.8 % (ref 43.0–77.0)
Neutro Abs: 5.2 10*3/uL (ref 1.4–7.7)
Platelets: 212 10*3/uL (ref 150.0–400.0)
RBC: 4.62 Mil/uL (ref 4.22–5.81)
RDW: 13.3 % (ref 11.5–15.5)
WBC: 8.3 10*3/uL (ref 4.0–10.5)

## 2017-12-27 LAB — COMPREHENSIVE METABOLIC PANEL
ALK PHOS: 128 U/L — AB (ref 39–117)
ALT: 80 U/L — AB (ref 0–53)
AST: 40 U/L — AB (ref 0–37)
Albumin: 4.4 g/dL (ref 3.5–5.2)
BILIRUBIN TOTAL: 0.6 mg/dL (ref 0.2–1.2)
BUN: 23 mg/dL (ref 6–23)
CO2: 32 meq/L (ref 19–32)
CREATININE: 1.21 mg/dL (ref 0.40–1.50)
Calcium: 10.1 mg/dL (ref 8.4–10.5)
Chloride: 103 mEq/L (ref 96–112)
GFR: 67.37 mL/min (ref >60.00)
GLUCOSE: 90 mg/dL (ref 70–99)
Potassium: 5.1 mEq/L (ref 3.5–5.1)
SODIUM: 141 meq/L (ref 135–145)
TOTAL PROTEIN: 6.8 g/dL (ref 6.0–8.3)

## 2017-12-27 NOTE — Telephone Encounter (Signed)
Future CMP placed. 

## 2017-12-28 ENCOUNTER — Telehealth: Payer: Self-pay | Admitting: Medical

## 2017-12-28 NOTE — Telephone Encounter (Signed)
  Jonathan Powell,  I saw pt the other day for near sycope type episode and anxiety history. Work up was unrevealing as to cause. I saw him also 2 years ago for near syncope type episode. He is [name] patient. Would you see if you can get him scheduled with [name]. He is stable now but states just does not feel "normal".  Unclear if at this point mostly dealing with anxiety. Or more in depth work up.  Thanks, Mackie Pai, PA-C

## 2017-12-31 NOTE — Telephone Encounter (Addendum)
Left detailed message to call as soon as possible to schedule appt this week as PCP has limited availability. [place] for [facility] / Triage to discuss with pt.

## 2018-01-08 NOTE — Telephone Encounter (Signed)
Attempted to reach pt to see if he wanted to schedule an appt prior to routine follow up in November. Left message to call if earlier appt needed. Pt previously declined app in 12/27/17 pt email that I did not see until later.

## 2018-01-08 NOTE — Telephone Encounter (Signed)
Noted  

## 2018-01-14 ENCOUNTER — Ambulatory Visit: Payer: 59 | Admitting: Family

## 2018-01-14 ENCOUNTER — Encounter: Payer: Self-pay | Admitting: Family

## 2018-01-14 VITALS — BP 114/70 | HR 66 | Temp 98.4°F | Resp 16 | Ht 71.0 in | Wt 186.0 lb

## 2018-01-14 DIAGNOSIS — R55 Syncope and collapse: Secondary | ICD-10-CM | POA: Diagnosis not present

## 2018-01-14 DIAGNOSIS — R945 Abnormal results of liver function studies: Secondary | ICD-10-CM

## 2018-01-14 DIAGNOSIS — R7989 Other specified abnormal findings of blood chemistry: Secondary | ICD-10-CM

## 2018-01-14 DIAGNOSIS — F419 Anxiety disorder, unspecified: Secondary | ICD-10-CM

## 2018-01-14 NOTE — Progress Notes (Signed)
Subjective:    Patient ID: Jonathan Powell, male    DOB: 14-Oct-1965, 52 y.o.   MRN: [id number]  HPI  Patient is a 52 yr old male who presents today for follow up. He saw Mackie Pai on 12/26/17 following a near syncopal event while weight lifting. LFT's were noted to be mildly elevated. He has had no further episodes since last visit.   Reports that he takes a daily vitamin, fish oil, protein shakes, electrolyte replacement powders. Does crossfit 6 days a week and has been eating a strict diet.   Wt Readings from Last 3 Encounters:  01/14/18 186 lb (84.4 kg)  12/26/17 183 lb 6.4 oz (83.2 kg)  09/28/17 181 lb 3.2 oz (82.2 kg)     ROS  See HPI  Past Medical History:  Diagnosis Date  . History of chicken pox   . History of kidney stones   . Pneumonia      Social History   Socioeconomic History  . Marital status: Married    Spouse name: Not on file  . Number of children: 2  . Years of education: Not on file  . Highest education level: Not on file  Occupational History  . Occupation: VP Scientist, clinical (histocompatibility and immunogenetics): [employer]    Comment: self employed  Social Needs  . Financial resource strain: Not on file  . Food insecurity:    Worry: Not on file    Inability: Not on file  . Transportation needs:    Medical: Not on file    Non-medical: Not on file  Tobacco Use  . Smoking status: Former Smoker    Packs/day: 1.00    Types: Cigarettes    Last attempt to quit: 07/12/2016    Years since quitting: 1.5  . Smokeless tobacco: Never Used  Substance and Sexual Activity  . Alcohol use: Yes    Alcohol/week: 0.0 standard drinks    Comment: social  . Drug use: No  . Sexual activity: Yes  Lifestyle  . Physical activity:    Days per week: Not on file    Minutes per session: Not on file  . Stress: Not on file  Relationships  . Social connections:    Talks on phone: Not on file    Gets together: Not on file    Attends religious service: Not on file    Active member of  club or organization: Not on file    Attends meetings of clubs or organizations: Not on file    Relationship status: Not on file  . Intimate partner violence:    Fear of current or ex partner: Not on file    Emotionally abused: Not on file    Physically abused: Not on file    Forced sexual activity: Not on file  Other Topics Concern  . Not on file  Social History Narrative   Occupation: vice Agricultural engineer, security company   Current Smoker-  2 PPD.   Alcohol use-yes,  very rare 1-2 drinks   Regular exercise-yes   Smoking Status:  current   Packs/Day:  1   Caffeine use/day:  3-4 daily   Does Patient Exercise:  Yes   Married- one biological son, one adopted son, step son    Past Surgical History:  Procedure Laterality Date  . CHOLECYSTECTOMY    . FOOT SURGERY Right 2001   toe surgery    Family History  Problem Relation Age of Onset  . Hyperlipidemia Mother   .  Irritable bowel syndrome Mother   . Heart attack Mother   . Arthritis Maternal Grandmother   . Kidney failure Paternal Grandfather   . Alcohol abuse Paternal Grandfather        deceased  . ADD / ADHD Son        adopted  . Alcohol abuse Father   . Kidney Stones Father     No Known Allergies  Current Outpatient Medications on File Prior to Visit  Medication Sig Dispense Refill  . aspirin EC 81 MG tablet Take 1 tablet (81 mg total) by mouth daily.    Marland Kitchen atorvastatin (LIPITOR) 20 MG tablet Take 1 tablet (20 mg total) by mouth daily. 90 tablet 3  . escitalopram (LEXAPRO) 20 MG tablet TAKE 1 TABLET (20 MG TOTAL) BY MOUTH DAILY. 90 tablet 1  . hydrOXYzine (ATARAX/VISTARIL) 10 MG tablet 1-2 tab po q 8 hours prn anxiety 15 tablet 0  . pantoprazole (PROTONIX) 40 MG tablet Take 1 tablet (40 mg total) by mouth 2 (two) times daily. 60 tablet 11  . [DISCONTINUED] omeprazole (PRILOSEC) 20 MG capsule Take 1 capsule (20 mg total) by mouth daily. 30 capsule 0   No current facility-administered medications on file prior to  visit.     BP 114/70 (BP Location: Right Arm, Patient Position: Sitting, Cuff Size: Small)   Pulse 66   Temp 98.4 F (36.9 C) (Oral)   Resp 16   Ht 5\' 11"  (1.803 m)   Wt 186 lb (84.4 kg)   SpO2 99%   BMI 25.94 kg/m    Objective:   Physical Exam  Constitutional: He is oriented to person, place, and time. He appears well-developed and well-nourished. No distress.  HENT:  Head: Normocephalic and atraumatic.  Cardiovascular: Normal rate and regular rhythm.  No murmur heard. Pulmonary/Chest: Effort normal and breath sounds normal. No respiratory distress. He has no wheezes. He has no rales.  Musculoskeletal: He exhibits no edema.  Neurological: He is alert and oriented to person, place, and time.  Skin: Skin is warm and dry.  Psychiatric: He has a normal mood and affect. His behavior is normal. Thought content normal.          Assessment & Plan:  Elevated LFT- repeat LFT today.  Has hx of fatty liver. Reports rare alcohol use.    Vasovagal event- per hx sounds like near syncope due to vasovagal event.  No further episodes.  Monitor.  Anxiety- worsened some since his recent episode- prior to that was doing pretty well. Continues lexapro. Will monitor on current dose.

## 2018-01-14 NOTE — Patient Instructions (Signed)
Please complete lab work prior to leaving.  Continue healthy diet and regular exercise.  

## 2018-01-15 ENCOUNTER — Other Ambulatory Visit: Payer: Self-pay | Admitting: Family

## 2018-01-15 DIAGNOSIS — N289 Disorder of kidney and ureter, unspecified: Secondary | ICD-10-CM

## 2018-01-15 LAB — COMPREHENSIVE METABOLIC PANEL
ALT: 70 U/L — ABNORMAL HIGH (ref 0–53)
AST: 31 U/L (ref 0–37)
Albumin: 4.2 g/dL (ref 3.5–5.2)
Alkaline Phosphatase: 123 U/L — ABNORMAL HIGH (ref 39–117)
BUN: 17 mg/dL (ref 6–23)
CHLORIDE: 105 meq/L (ref 96–112)
CO2: 31 meq/L (ref 19–32)
Calcium: 9.6 mg/dL (ref 8.4–10.5)
Creatinine, Ser: 1.57 mg/dL — ABNORMAL HIGH (ref 0.40–1.50)
GFR: 49.87 mL/min — ABNORMAL LOW (ref >60.00)
GLUCOSE: 93 mg/dL (ref 70–99)
POTASSIUM: 4.1 meq/L (ref 3.5–5.1)
SODIUM: 142 meq/L (ref 135–145)
Total Bilirubin: 0.3 mg/dL (ref 0.2–1.2)
Total Protein: 6.5 g/dL (ref 6.0–8.3)

## 2018-01-31 ENCOUNTER — Other Ambulatory Visit: Payer: 59

## 2018-02-08 ENCOUNTER — Other Ambulatory Visit (INDEPENDENT_AMBULATORY_CARE_PROVIDER_SITE_OTHER): Payer: 59

## 2018-02-08 DIAGNOSIS — N289 Disorder of kidney and ureter, unspecified: Secondary | ICD-10-CM

## 2018-02-08 LAB — BASIC METABOLIC PANEL
BUN: 17 mg/dL (ref 6–23)
CHLORIDE: 105 meq/L (ref 96–112)
CO2: 31 mEq/L (ref 19–32)
Calcium: 9.9 mg/dL (ref 8.4–10.5)
Creatinine, Ser: 1.12 mg/dL (ref 0.40–1.50)
GFR: 73.62 mL/min (ref >60.00)
GLUCOSE: 97 mg/dL (ref 70–99)
POTASSIUM: 4.5 meq/L (ref 3.5–5.1)
SODIUM: 141 meq/L (ref 135–145)

## 2018-02-09 ENCOUNTER — Encounter: Payer: Self-pay | Admitting: Family

## 2018-02-13 ENCOUNTER — Telehealth: Payer: 59 | Admitting: Family

## 2018-02-13 DIAGNOSIS — J208 Acute bronchitis due to other specified organisms: Secondary | ICD-10-CM

## 2018-02-13 DIAGNOSIS — B9689 Other specified bacterial agents as the cause of diseases classified elsewhere: Secondary | ICD-10-CM

## 2018-02-13 MED ORDER — AZITHROMYCIN 250 MG PO TABS: ORAL_TABLET | ORAL | 0 refills | Status: DC

## 2018-02-13 MED ORDER — BENZONATATE 100 MG PO CAPS: 100.0000 mg | ORAL_CAPSULE | Freq: Three times a day (TID) | ORAL | 0 refills | Status: DC | PRN

## 2018-02-13 NOTE — Progress Notes (Signed)

## 2018-03-28 ENCOUNTER — Other Ambulatory Visit: Payer: Self-pay | Admitting: Family

## 2018-04-01 ENCOUNTER — Ambulatory Visit: Payer: 59 | Admitting: Family

## 2018-06-25 ENCOUNTER — Telehealth: Payer: Self-pay | Admitting: Family

## 2018-06-28 ENCOUNTER — Other Ambulatory Visit: Payer: Self-pay | Admitting: Family

## 2018-06-28 NOTE — Telephone Encounter (Signed)
Patient's assistant calling and states that the patient has run out of this medication and has an appointment scheduled for 07/19/2018. Per med refill, it says responded to by other means. Please advise. Would like to know if enough medication could be sent to the pharmacy to last him until that appointment? Cash, Jonathan Powell AT [place]

## 2018-06-29 ENCOUNTER — Other Ambulatory Visit: Payer: Self-pay | Admitting: Family

## 2018-07-01 ENCOUNTER — Other Ambulatory Visit: Payer: Self-pay

## 2018-07-01 NOTE — Telephone Encounter (Signed)
rx sent

## 2018-07-15 ENCOUNTER — Encounter: Payer: 59 | Admitting: Family

## 2018-07-19 ENCOUNTER — Encounter: Payer: Self-pay | Admitting: Family

## 2018-07-19 ENCOUNTER — Ambulatory Visit (INDEPENDENT_AMBULATORY_CARE_PROVIDER_SITE_OTHER): Payer: 59 | Admitting: Family

## 2018-07-19 ENCOUNTER — Other Ambulatory Visit: Payer: Self-pay

## 2018-07-19 VITALS — BP 131/67 | HR 74 | Temp 98.6°F | Resp 16 | Ht 73.0 in | Wt 196.0 lb

## 2018-07-19 DIAGNOSIS — F418 Other specified anxiety disorders: Secondary | ICD-10-CM | POA: Diagnosis not present

## 2018-07-19 DIAGNOSIS — M25511 Pain in right shoulder: Secondary | ICD-10-CM | POA: Diagnosis not present

## 2018-07-19 DIAGNOSIS — Z0000 Encounter for general adult medical examination without abnormal findings: Secondary | ICD-10-CM | POA: Diagnosis not present

## 2018-07-19 DIAGNOSIS — Z23 Encounter for immunization: Secondary | ICD-10-CM | POA: Diagnosis not present

## 2018-07-19 MED ORDER — ATORVASTATIN CALCIUM 20 MG PO TABS: 10.0000 mg | ORAL_TABLET | Freq: Every day | ORAL | 3 refills | Status: DC

## 2018-07-19 NOTE — Progress Notes (Signed)
Subjective:    Patient ID: Jonathan Powell, male    DOB: Mar 12, 1966, 53 y.o.   MRN: [id number]  HPI  Patient presents today for complete physical.  Immunizations: declines flu shot Diet: reports healthy diet Exercise: regular exercise Colonoscopy: due Vision: <1 yr ago Dental: goes every 6 months  Right shoulder pain- has been present x 90 days. Not improving with exercise and chiropractic.   Anxiety/Depression- pt reports that he is feeling well and wishes to come off of lexapro.   Review of Systems  Constitutional: Negative for unexpected weight change.  HENT: Negative for rhinorrhea.   Respiratory: Negative for cough and shortness of breath.   Cardiovascular: Negative for chest pain and leg swelling.  Gastrointestinal: Negative for constipation and diarrhea.  Genitourinary: Negative for dysuria, frequency and hematuria.  Musculoskeletal: Positive for arthralgias. Negative for myalgias.       R shoulder pain  Skin: Negative for rash.  Neurological: Negative for headaches.  Psychiatric/Behavioral:       Denies depression/anxiety       Past Medical History:  Diagnosis Date  . History of chicken pox   . History of kidney stones   . Pneumonia      Social History   Socioeconomic History  . Marital status: Married    Spouse name: Not on file  . Number of children: 2  . Years of education: Not on file  . Highest education level: Not on file  Occupational History  . Occupation: VP Scientist, clinical (histocompatibility and immunogenetics): [employer]    Comment: self employed  Social Needs  . Financial resource strain: Not on file  . Food insecurity:    Worry: Not on file    Inability: Not on file  . Transportation needs:    Medical: Not on file    Non-medical: Not on file  Tobacco Use  . Smoking status: Former Smoker    Packs/day: 1.00    Types: Cigarettes    Last attempt to quit: 07/12/2016    Years since quitting: 2.0  . Smokeless tobacco: Never Used  Substance and Sexual Activity   . Alcohol use: Yes    Alcohol/week: 0.0 standard drinks    Comment: social  . Drug use: No  . Sexual activity: Yes  Lifestyle  . Physical activity:    Days per week: Not on file    Minutes per session: Not on file  . Stress: Not on file  Relationships  . Social connections:    Talks on phone: Not on file    Gets together: Not on file    Attends religious service: Not on file    Active member of club or organization: Not on file    Attends meetings of clubs or organizations: Not on file    Relationship status: Not on file  . Intimate partner violence:    Fear of current or ex partner: Not on file    Emotionally abused: Not on file    Physically abused: Not on file    Forced sexual activity: Not on file  Other Topics Concern  . Not on file  Social History Narrative   Occupation: vice Agricultural engineer, security company   Current Smoker-  2 PPD.   Alcohol use-yes,  very rare 1-2 drinks   Regular exercise-yes   Smoking Status:  current   Packs/Day:  1   Caffeine use/day:  3-4 daily   Does Patient Exercise:  Yes   Married- one biological son,  one adopted son, step son    Past Surgical History:  Procedure Laterality Date  . CHOLECYSTECTOMY    . FOOT SURGERY Right 2001   toe surgery    Family History  Problem Relation Age of Onset  . Hyperlipidemia Mother   . Irritable bowel syndrome Mother   . Heart attack Mother   . Arthritis Maternal Grandmother   . Kidney failure Paternal Grandfather   . Alcohol abuse Paternal Grandfather        deceased  . ADD / ADHD Son        adopted  . Alcohol abuse Father   . Kidney Stones Father     No Known Allergies  Current Outpatient Medications on File Prior to Visit  Medication Sig Dispense Refill  . aspirin EC 81 MG tablet Take 1 tablet (81 mg total) by mouth daily.    Marland Kitchen escitalopram (LEXAPRO) 20 MG tablet TAKE 1 TABLET BY MOUTH EVERY DAY 90 tablet 1  . [DISCONTINUED] omeprazole (PRILOSEC) 20 MG capsule Take 1 capsule (20 mg  total) by mouth daily. 30 capsule 0   No current facility-administered medications on file prior to visit.     BP 131/67 (BP Location: Right Arm, Patient Position: Sitting, Cuff Size: Small)   Pulse 74   Temp 98.6 F (37 C) (Oral)   Resp 16   Ht 6\' 1"  (1.854 m)   Wt 196 lb (88.9 kg)   SpO2 99%   BMI 25.86 kg/m    Objective:   Physical Exam   Physical Exam  Constitutional: He is oriented to person, place, and time. He appears well-developed and well-nourished. No distress.  HENT:  Head: Normocephalic and atraumatic.  Right Ear: Tympanic membrane and ear canal normal.  Left Ear: Tympanic membrane and ear canal normal.  Mouth/Throat: Oropharynx is clear and moist.  Eyes: Pupils are equal, round, and reactive to light. No scleral icterus.  Neck: Normal range of motion. No thyromegaly present.  Cardiovascular: Normal rate and regular rhythm.   No murmur heard. Pulmonary/Chest: Effort normal and breath sounds normal. No respiratory distress. He has no wheezes. He has no rales. He exhibits no tenderness.  Abdominal: Soft. Bowel sounds are normal. He exhibits no distension and no mass. There is no tenderness. There is no rebound and no guarding.  Musculoskeletal: He exhibits no edema.  Lymphadenopathy:    He has no cervical adenopathy.  Neurological: He is alert and oriented to person, place, and time. He has normal patellar reflexes. He exhibits normal muscle tone. Coordination normal.  Skin: Skin is warm and dry.  Psychiatric: He has a normal mood and affect. His behavior is normal. Judgment and thought content normal.           Assessment & Plan:   Preventative care- encouraged pt to continue healthy diet and regular exercise.  Also, he showed me a copy of his lab work which was done as part of a Landscape architect which I have reviewed on his phone. Will obtain TSH, PSA which were not included. I have asked the patient to send [place] a copy via mychart so we can have these  results in his chart. Shingrix #1 today.   Anxiety/Depression-stable.  Will cut lexapro down from 20mg  to 10mg .  Plan follow up in 2 months for shingrix #2 and further tapering of lexapro if symptoms are stable on lower dose.   Right shoulder pain- new. Will refer to sports medicine for further evaluation.  Assessment & Plan:

## 2018-07-19 NOTE — Patient Instructions (Signed)
Please cut lexapro in half and take 10mg  once daily.

## 2018-07-20 LAB — PSA: PSA: 1.1 ng/mL (ref <=4.0)

## 2018-07-20 LAB — TSH: TSH: 0.59 mIU/L (ref 0.40–4.50)

## 2018-07-25 ENCOUNTER — Encounter: Payer: Self-pay | Admitting: Gastroenterology

## 2018-07-29 ENCOUNTER — Ambulatory Visit: Payer: 59 | Admitting: Family Medicine

## 2018-07-29 ENCOUNTER — Encounter: Payer: Self-pay | Admitting: Family Medicine

## 2018-07-29 ENCOUNTER — Other Ambulatory Visit: Payer: Self-pay

## 2018-07-29 VITALS — BP 125/77 | HR 88 | Temp 98.4°F | Ht 72.0 in | Wt 190.0 lb

## 2018-07-29 DIAGNOSIS — G8929 Other chronic pain: Secondary | ICD-10-CM

## 2018-07-29 DIAGNOSIS — M25511 Pain in right shoulder: Secondary | ICD-10-CM

## 2018-07-29 MED ORDER — NITROGLYCERIN 0.2 MG/HR TD PT24: MEDICATED_PATCH | TRANSDERMAL | 1 refills | Status: DC

## 2018-07-29 NOTE — Patient Instructions (Signed)
You have rotator cuff impingement Try to avoid painful activities (overhead activities, lifting with extended arm) as much as possible. Aleve 2 tabs twice a day with food OR ibuprofen 3 tabs three times a day with food for pain and inflammation only as needed. Can take tylenol in addition to this. Start nitro patches 1/4th patch to affected shoulder, change daily. Subacromial injection may be beneficial to help with pain and to decrease inflammation. Consider physical therapy with transition to home exercise program. Do home exercise program with theraband and scapular stabilization exercises daily 3 sets of 10 once a day. Consider increasing patch to 1/2 patch and changing daily if tolerating this. Follow up with me in 6 weeks or let me know how you're doing at that time.

## 2018-07-29 NOTE — Progress Notes (Signed)
PCP: Debbrah Alar, NP  Subjective:   HPI: Patient is a 53 y.o. male here for right shoulder pain  Patient presents with right shoulder pain for 6 months.  He denies any specific injury but believes it is likely due to CrossFit exercises.  He works as a Associate Professor.  He reports 3/10 pain regularly but can be 8/10 at its worst with workouts.  Since onset, he is decreased the amount of overhead exercises he does.  He has been working with a Restaurant manager, fast food and physical therapist receiving acupuncture, dry needling and cryotherapy.  He reports no significant improvement with these interventions.  He also notes his pain is worse with laying on his shoulder as well as overhead movement.  He notes feeling decreased range of motion with flexion and abduction compared to the left side.  He denies any new swelling or bruising.  No skin changes.  No distal numbness or tingling  Past Medical History:  Diagnosis Date  . History of chicken pox   . History of kidney stones   . Pneumonia     Current Outpatient Medications on File Prior to Visit  Medication Sig Dispense Refill  . aspirin EC 81 MG tablet Take 1 tablet (81 mg total) by mouth daily.    Marland Kitchen atorvastatin (LIPITOR) 20 MG tablet Take 0.5 tablets (10 mg total) by mouth daily. 90 tablet 3  . escitalopram (LEXAPRO) 20 MG tablet TAKE 1 TABLET BY MOUTH EVERY DAY 90 tablet 1  . [DISCONTINUED] omeprazole (PRILOSEC) 20 MG capsule Take 1 capsule (20 mg total) by mouth daily. 30 capsule 0   No current facility-administered medications on file prior to visit.     Past Surgical History:  Procedure Laterality Date  . CHOLECYSTECTOMY    . FOOT SURGERY Right 2001   toe surgery    No Known Allergies  Social History   Socioeconomic History  . Marital status: Married    Spouse name: Not on file  . Number of children: 2  . Years of education: Not on file  . Highest education level: Not on file  Occupational History  . Occupation: VP Fish farm manager: [employer]    Comment: self employed  Social Needs  . Financial resource strain: Not on file  . Food insecurity:    Worry: Not on file    Inability: Not on file  . Transportation needs:    Medical: Not on file    Non-medical: Not on file  Tobacco Use  . Smoking status: Former Smoker    Packs/day: 1.00    Types: Cigarettes    Last attempt to quit: 07/12/2016    Years since quitting: 2.0  . Smokeless tobacco: Never Used  Substance and Sexual Activity  . Alcohol use: Yes    Alcohol/week: 0.0 standard drinks    Comment: social  . Drug use: No  . Sexual activity: Yes  Lifestyle  . Physical activity:    Days per week: Not on file    Minutes per session: Not on file  . Stress: Not on file  Relationships  . Social connections:    Talks on phone: Not on file    Gets together: Not on file    Attends religious service: Not on file    Active member of club or organization: Not on file    Attends meetings of clubs or organizations: Not on file    Relationship status: Not on file  . Intimate partner violence:  Fear of current or ex partner: Not on file    Emotionally abused: Not on file    Physically abused: Not on file    Forced sexual activity: Not on file  Other Topics Concern  . Not on file  Social History Narrative   Occupation: vice Agricultural engineer, security company   Current Smoker-  2 PPD.   Alcohol use-yes,  very rare 1-2 drinks   Regular exercise-yes   Smoking Status:  current   Packs/Day:  1   Caffeine use/day:  3-4 daily   Does Patient Exercise:  Yes   Married- one biological son, one adopted son, step son    Family History  Problem Relation Age of Onset  . Hyperlipidemia Mother   . Irritable bowel syndrome Mother   . Heart attack Mother   . Arthritis Maternal Grandmother   . Kidney failure Paternal Grandfather   . Alcohol abuse Paternal Grandfather        deceased  . ADD / ADHD Son        adopted  . Alcohol abuse Father   . Kidney  Stones Father     BP 125/77   Pulse 88   Temp 98.4 F (36.9 C) (Oral)   Ht 6' (1.829 m)   Wt 190 lb (86.2 kg)   BMI 25.77 kg/m   Review of Systems: See HPI above.     Objective:  Physical Exam:  Gen: awake, alert, NAD, comfortable in exam room Pulm: breathing unlabored  Right Shoulder: No obvious deformity or asymmetry. No bruising. No swelling No TTP Slightly decreased flexion and abduction compared to the left NV intact distally Special Tests:  - Impingement:  positive Hawkins and Neers.  - Supraspinatus: Mild pain with empty can.  5/5 strength - Infraspinatus/Teres: 5/5 strength with ER - Subscapularis: negative belly press. 5/5 strength with IR - Biceps tendon: Negative Speeds.  - Labrum: Mild pain with Obriens.  - AC Joint: Negative cross arm - Negative apprehension test   MSK [place]: [place] Right Shoulder  BT short: normal appearance, small split tear noted where the tendon crosses below subscap, no tenosynovitis BT long: normal appearance, small split tear seen Supraspinatus tendon: normal appearance, no tears of degenerative changes, trace fluid within the bursa Subscapularis tendon: normal appearance without tears or degenerative changes Infraspinatus tendon:  normal appearance without tears or degenerative changes Teres Minor tendon:  normal appearance without tears or degenerative changes GH joint: normal appearing posterior labrum AC joint: no  degenerative changes, no geyser sign  Summary and Additional findings - mild bursitis. Small split tear within the proximal biceps tendon  Left shoulder: No deformity No TTP over the biceps tendon Full ROM 5/5 strength wit RTC testing nv intact distally   Assessment & Plan:  1. Right shoulder pain -  2/2 impingement. Small biceps split tear on [place] does not seem to be clinically significant - aleve or ibuprofen - nitro patches - consider steroid injection if pain persists - home exercises, consider formal PT -  f/u 6 weeks.

## 2018-07-31 ENCOUNTER — Encounter: Payer: Self-pay | Admitting: Family

## 2018-07-31 MED ORDER — ATORVASTATIN CALCIUM 20 MG PO TABS: 10.0000 mg | ORAL_TABLET | Freq: Every day | ORAL | 3 refills | Status: DC

## 2018-08-15 ENCOUNTER — Ambulatory Visit (AMBULATORY_SURGERY_CENTER): Payer: 59

## 2018-08-15 ENCOUNTER — Other Ambulatory Visit: Payer: Self-pay

## 2018-08-15 VITALS — Ht 71.0 in | Wt 190.0 lb

## 2018-08-15 DIAGNOSIS — Z1211 Encounter for screening for malignant neoplasm of colon: Secondary | ICD-10-CM

## 2018-08-15 MED ORDER — PEG 3350-KCL-NA BICARB-NACL 420 G PO SOLR: 4000.0000 mL | Freq: Once | ORAL | 0 refills | Status: AC

## 2018-08-15 NOTE — Progress Notes (Signed)
Denies allergies to eggs or soy products. Denies complication of anesthesia or sedation. Denies use of weight loss medication. Denies use of O2.   Emmi instructions given for colonoscopy.  Pre-Visit was conducted by phone due to Covid 19. Insurance was verified. Instructions were mailed to confirmed patients address. Patient was instructed to call if he had questions or concerns regarding instructions.

## 2018-08-30 ENCOUNTER — Encounter: Payer: 59 | Admitting: Gastroenterology

## 2018-09-09 ENCOUNTER — Telehealth: Payer: Self-pay | Admitting: *Deleted

## 2018-09-09 NOTE — Telephone Encounter (Signed)
Called pt, no answer. Left message for him to call us back to reschedule the colonoscopy.

## 2018-09-10 ENCOUNTER — Encounter: Payer: Self-pay | Admitting: Gastroenterology

## 2018-09-11 ENCOUNTER — Ambulatory Visit: Payer: 59 | Admitting: Family Medicine

## 2018-09-24 ENCOUNTER — Encounter: Payer: 59 | Admitting: Gastroenterology

## 2018-09-25 ENCOUNTER — Telehealth: Payer: Self-pay | Admitting: *Deleted

## 2018-09-25 NOTE — Telephone Encounter (Signed)
Called patient to complete pre-procedure Covid-19 screening. No answer, LMOM.

## 2018-09-26 ENCOUNTER — Telehealth: Payer: Self-pay | Admitting: *Deleted

## 2018-09-26 NOTE — Telephone Encounter (Signed)
Covid-19 travel screening questions  Have you traveled in the last 14 days?no If yes where?  Do you now or have you had a fever in the last 14 days?no  Do you have any respiratory symptoms of shortness of breath or cough now or in the last 14 days?no  Do you have a medical history of Congestive Heart Failure?  Do you have a medical history of lung disease?  Do you have any family members or close contacts with diagnosed or suspected Covid-19?no  Pt made aware of care partner policy and will bring a mask if he has one available. Sm

## 2018-09-27 ENCOUNTER — Other Ambulatory Visit: Payer: Self-pay

## 2018-09-27 ENCOUNTER — Ambulatory Visit (AMBULATORY_SURGERY_CENTER): Payer: 59 | Admitting: Gastroenterology

## 2018-09-27 ENCOUNTER — Encounter: Payer: Self-pay | Admitting: Gastroenterology

## 2018-09-27 ENCOUNTER — Ambulatory Visit (INDEPENDENT_AMBULATORY_CARE_PROVIDER_SITE_OTHER): Payer: 59 | Admitting: Family

## 2018-09-27 VITALS — BP 104/57 | HR 63 | Temp 98.3°F | Resp 16 | Ht 71.0 in | Wt 190.0 lb

## 2018-09-27 DIAGNOSIS — D124 Benign neoplasm of descending colon: Secondary | ICD-10-CM

## 2018-09-27 DIAGNOSIS — F329 Major depressive disorder, single episode, unspecified: Secondary | ICD-10-CM | POA: Diagnosis not present

## 2018-09-27 DIAGNOSIS — D125 Benign neoplasm of sigmoid colon: Secondary | ICD-10-CM | POA: Diagnosis not present

## 2018-09-27 DIAGNOSIS — E785 Hyperlipidemia, unspecified: Secondary | ICD-10-CM

## 2018-09-27 DIAGNOSIS — Z1211 Encounter for screening for malignant neoplasm of colon: Secondary | ICD-10-CM

## 2018-09-27 DIAGNOSIS — D122 Benign neoplasm of ascending colon: Secondary | ICD-10-CM

## 2018-09-27 DIAGNOSIS — F419 Anxiety disorder, unspecified: Secondary | ICD-10-CM | POA: Diagnosis not present

## 2018-09-27 DIAGNOSIS — K635 Polyp of colon: Secondary | ICD-10-CM

## 2018-09-27 MED ORDER — SODIUM CHLORIDE 0.9 % IV SOLN: 500.0000 mL | Freq: Once | INTRAVENOUS | Status: DC

## 2018-09-27 MED ORDER — ESCITALOPRAM OXALATE 5 MG PO TABS: ORAL_TABLET | ORAL | 0 refills | Status: DC

## 2018-09-27 NOTE — Progress Notes (Signed)
Pt's states no medical or surgical changes since previsit or office visit. 

## 2018-09-27 NOTE — Progress Notes (Signed)
Called to room to assist during endoscopic procedure.  Patient ID and intended procedure confirmed with present staff. Received instructions for my participation in the procedure from the performing physician.  

## 2018-09-27 NOTE — Patient Instructions (Signed)
Discharge instructions given. Handout on polyps. Resume previous medications. YOU HAD AN ENDOSCOPIC PROCEDURE TODAY AT [facility]:   Refer to the procedure report that was given to you for any specific questions about what was found during the examination.  If the procedure report does not answer your questions, please call your gastroenterologist to clarify.  If you requested that your care partner not be given the details of your procedure findings, then the procedure report has been included in a sealed envelope for you to review at your convenience later.  YOU SHOULD EXPECT: Some feelings of bloating in the abdomen. Passage of more gas than usual.  Walking can help get rid of the air that was put into your GI tract during the procedure and reduce the bloating. If you had a lower endoscopy (such as a colonoscopy or flexible sigmoidoscopy) you may notice spotting of blood in your stool or on the toilet paper. If you underwent a bowel prep for your procedure, you may not have a normal bowel movement for a few days.  Please Note:  You might notice some irritation and congestion in your nose or some drainage.  This is from the oxygen used during your procedure.  There is no need for concern and it should clear up in a day or so.  SYMPTOMS TO REPORT IMMEDIATELY:   Following lower endoscopy (colonoscopy or flexible sigmoidoscopy):  Excessive amounts of blood in the stool  Significant tenderness or worsening of abdominal pains  Swelling of the abdomen that is new, acute  Fever of 100F or higher   For urgent or emergent issues, a gastroenterologist can be reached at any hour by calling [phone].   DIET:  We do recommend a small meal at first, but then you may proceed to your regular diet.  Drink plenty of fluids but you should avoid alcoholic beverages for 24 hours.  ACTIVITY:  You should plan to take it easy for the rest of today and you should NOT DRIVE or use heavy  machinery until tomorrow (because of the sedation medicines used during the test).    FOLLOW UP: Our staff will call the number listed on your records 48-72 hours following your procedure to check on you and address any questions or concerns that you may have regarding the information given to you following your procedure. If we do not reach you, we will leave a message.  We will attempt to reach you two times.  During this call, we will ask if you have developed any symptoms of COVID 19. If you develop any symptoms (for example fever, flu-like symptoms, shortness of breath, cough etc.) before then, please call [phone].  If any biopsies were taken you will be contacted by phone or by letter within the next 1-3 weeks.  Please call us at [phone] if you have not heard about the biopsies in 3 weeks.    SIGNATURES/CONFIDENTIALITY: You and/or your care partner have signed paperwork which will be entered into your electronic medical record.  These signatures attest to the fact that that the information above on your After Visit Summary has been reviewed and is understood.  Full responsibility of the confidentiality of this discharge information lies with you and/or your care-partner.

## 2018-09-27 NOTE — Progress Notes (Signed)
Virtual Visit via Video Note  I connected with Jonathan Powell  on 09/27/18 at  1:00 PM EDT by a video enabled telemedicine application and verified that I am speaking with the correct person using two identifiers. This visit type was conducted due to national recommendations for restrictions regarding the COVID-19 Pandemic (e.g. social distancing).  This format is felt to be most appropriate for this patient at this time.   I discussed the limitations of evaluation and management by telemedicine and the availability of in person appointments. The patient expressed understanding and agreed to proceed.  Only the patient and myself were on today's video visit. The patient was at home and I was in my office at the time of today's visit.   History of Present Illness:  Patient is a 53 yr old male who presents today for follow up.   Anxiety/Depression- he reports that he has been cutting the lexapro in half (from 20mg  to 10mg ).  Reports that mood temperament/anxiety are "exactly the same." Feels that he is handling the stress of COVID-19 quite well. He desires to try to come completely off of lexapro.  Hyperlipidemia- Pt reports that he continues atorvastatin. Denies myalgia. Continues to work out daily.  Lab Results  Component Value Date   CHOL 175 09/28/2017   HDL 47 09/28/2017   LDLCALC 106 (H) 09/28/2017   TRIG 123 09/28/2017   CHOLHDL 3.7 09/28/2017   Observations/Objective:   Gen: Awake, alert, no acute distress Resp: Breathing is even and non-labored Psych: calm/pleasant demeanor Neuro: Alert and Oriented x 3, + facial symmetry, speech is clear.  Assessment and Plan:  1) Anxiety/depression- advised pt to decrease lexapro to 5mg  daily x 2 weeks, then 5 mg QOD x 2 weeks then stop. He is advised to call me if he has any worsening mood/anxiety coming off of lexapro and he agrees to do so.  2) Hyperlipidemia- tolerating statin. Continue same. He will schedule a lab visit at his  convenience to repeat lipid panel.   Follow Up Instructions:    I discussed the assessment and treatment plan with the patient. The patient was provided an opportunity to ask questions and all were answered. The patient agreed with the plan and demonstrated an understanding of the instructions.   The patient was advised to call back or seek an in-person evaluation if the symptoms worsen or if the condition fails to improve as anticipated.    [name], NP

## 2018-09-27 NOTE — Progress Notes (Signed)
PT taken to PACU. Monitors in place. VSS. Report given to RN. 

## 2018-09-27 NOTE — Op Note (Signed)
[facility] Patient Name: Jonathan Powell Procedure Date: 09/27/2018 9:01 AM MRN: [id number] Endoscopist: Milus Banister , MD Age: 53 Referring MD:  Date of Birth: Mar 19, 1966 Gender: Male Account #: [id number] Procedure:                Colonoscopy Indications:              Screening for colorectal malignant neoplasm Medicines:                Monitored Anesthesia Care Procedure:                Pre-Anesthesia Assessment:                           - Prior to the procedure, a History and Physical                            was performed, and patient medications and                            allergies were reviewed. The patient's tolerance of                            previous anesthesia was also reviewed. The risks                            and benefits of the procedure and the sedation                            options and risks were discussed with the patient.                            All questions were answered, and informed consent                            was obtained. Prior Anticoagulants: The patient has                            taken no previous anticoagulant or antiplatelet                            agents. ASA Grade Assessment: II - A patient with                            mild systemic disease. After reviewing the risks                            and benefits, the patient was deemed in                            satisfactory condition to undergo the procedure.                           After obtaining informed consent, the colonoscope  was passed under direct vision. Throughout the                            procedure, the patient's blood pressure, pulse, and                            oxygen saturations were monitored continuously. The                            Colonoscope was introduced through the anus and                            advanced to the the cecum, identified by                            appendiceal orifice and  ileocecal valve. The                            colonoscopy was performed without difficulty. The                            patient tolerated the procedure well. The quality                            of the bowel preparation was good. The ileocecal                            valve, appendiceal orifice, and rectum were                            photographed. Scope In: 9:03:41 AM Scope Out: 9:21:53 AM Scope Withdrawal Time: 0 hours 16 minutes 31 seconds  Total Procedure Duration: 0 hours 18 minutes 12 seconds  Findings:                 Seven sessile polyps were found in the sigmoid                            colon, descending colon and ascending colon. The                            polyps were 2 to 9 mm in size. These polyps were                            removed with a cold snare. Resection and retrieval                            were complete.                           A 12 mm polyp was found in the sigmoid colon. The                            polyp was sessile. The polyp was removed with  a hot                            snare. Resection and retrieval were complete.                           The exam was otherwise without abnormality on                            direct and retroflexion views. Complications:            No immediate complications. Estimated blood loss:                            None. Estimated Blood Loss:     Estimated blood loss: none. Impression:               - Seven 2 to 9 mm polyps in the sigmoid colon, in                            the descending colon and in the ascending colon,                            removed with a cold snare. Resected and retrieved.                           - One 12 mm polyp in the sigmoid colon, removed                            with a hot snare. Resected and retrieved.                           - The examination was otherwise normal on direct                            and retroflexion views. Recommendation:           - Patient  has a contact number available for                            emergencies. The signs and symptoms of potential                            delayed complications were discussed with the                            patient. Return to normal activities tomorrow.                            Written discharge instructions were provided to the                            patient.                           - Resume previous diet.                           -  Continue present medications.                           You will receive a letter within 2-3 weeks with the                            pathology results and my final recommendations.                           If the polyp(s) is proven to be 'pre-cancerous' on                            pathology, you will need repeat colonoscopy in 3                            years. If the polyp(s) is NOT 'precancerous' on                            pathology then you should repeat colon cancer                            screening in 10 years with colonoscopy without need                            for colon cancer screening by any method prior to                            then (including stool testing). Milus Banister, MD 09/27/2018 9:23:54 AM This report has been signed electronically.

## 2018-09-27 NOTE — Progress Notes (Signed)
Temperature taken by [facility], CMA and VS taken by Rica Mote, CMA

## 2018-10-01 ENCOUNTER — Telehealth: Payer: Self-pay | Admitting: *Deleted

## 2018-10-01 NOTE — Telephone Encounter (Signed)
1. Have you developed a fever since your procedure?no  2.   Have you had an respiratory symptoms (SOB or cough) since your procedure? no  3.   Have you tested positive for COVID 19 since your procedure no  4.   Have you had any family members/close contacts diagnosed with the COVID 19 since your procedure?  no   If any of these questions are a yes, please inquire if patient has been seen by family doctor and route this note to Joylene John, Therapist, sports.

## 2018-10-01 NOTE — Telephone Encounter (Signed)
  Follow up Call-  Call back number 09/27/2018  Post procedure Call Back phone  # [phone]  Permission to leave phone message Yes  Some recent data might be hidden     Patient questions:  Do you have a fever, pain , or abdominal swelling? No. Pain Score  0 *  Have you tolerated food without any problems? Yes.    Have you been able to return to your normal activities? Yes.    Do you have any questions about your discharge instructions: Diet   No. Medications  No. Follow up visit  No.  Do you have questions or concerns about your Care? No.  Actions: * If pain score is 4 or above: No action needed, pain <4.    1. Have you developed a fever since your procedure? NO  2.   Have you had an respiratory symptoms (SOB or cough) since your procedure? NO  3.   Have you tested positive for COVID 19 since your procedure ?  4.   Have you had any family members/close contacts diagnosed with the COVID 19 since your procedure?   If any of these questions are a yes, please inquire if patient has been seen by family doctor and route this note to Joylene John, Therapist, sports.

## 2018-10-01 NOTE — Telephone Encounter (Signed)
Left message on f/u call 

## 2018-10-03 ENCOUNTER — Encounter: Payer: Self-pay | Admitting: Gastroenterology

## 2018-10-07 ENCOUNTER — Encounter: Payer: Self-pay | Admitting: Family

## 2018-10-07 NOTE — Telephone Encounter (Signed)
Spoke to patient. He is on the 5mg  dose. Has been on it for a few days. Advised him to continue at this dose for several weeks. Only go to every other day if he feels better on the 5mg .  Otherwise, if symptoms do not improve advised pt to go back to the 10mg .  He verbalizes understanding and agrees to keep me posted.

## 2018-10-16 ENCOUNTER — Other Ambulatory Visit: Payer: Self-pay

## 2018-10-16 ENCOUNTER — Encounter: Payer: Self-pay | Admitting: Family Medicine

## 2018-10-16 ENCOUNTER — Ambulatory Visit: Payer: 59 | Admitting: Family Medicine

## 2018-10-16 VITALS — BP 120/70 | Ht 71.0 in | Wt 193.0 lb

## 2018-10-16 DIAGNOSIS — M25511 Pain in right shoulder: Secondary | ICD-10-CM

## 2018-10-16 DIAGNOSIS — G8929 Other chronic pain: Secondary | ICD-10-CM | POA: Diagnosis not present

## 2018-10-16 MED ORDER — METHYLPREDNISOLONE ACETATE 40 MG/ML IJ SUSP
40.0000 mg | Freq: Once | INTRAMUSCULAR | Status: AC
  Administered 2018-10-16: 40 mg via INTRA_ARTICULAR

## 2018-10-16 NOTE — Progress Notes (Signed)
PCP: Debbrah Alar, NP  Subjective:   HPI: Patient is a 53 y.o. male here for right shoulder pain.  3/23: Patient presents with right shoulder pain for 6 months.  He denies any specific injury but believes it is likely due to CrossFit exercises.  He works as a Associate Professor.  He reports 3/10 pain regularly but can be 8/10 at its worst with workouts.  Since onset, he is decreased the amount of overhead exercises he does.  He has been working with a Restaurant manager, fast food and physical therapist receiving acupuncture, dry needling and cryotherapy.  He reports no significant improvement with these interventions.  He also notes his pain is worse with laying on his shoulder as well as overhead movement.  He notes feeling decreased range of motion with flexion and abduction compared to the left side.  He denies any new swelling or bruising.  No skin changes.  No distal numbness or tingling  6/10: Patient returns with continued right shoulder pain. No change from last visit. Tried nitro patches without relief. Feels motion is limited in abduction, flexion, external rotation and painful. Acupuncture provides only transient benefit. Doing home exercises. No skin changes. No new injuries.  Past Medical History:  Diagnosis Date  . Anxiety   . GERD (gastroesophageal reflux disease)   . History of chicken pox   . History of kidney stones   . Hyperlipidemia   . Pneumonia     Current Outpatient Medications on File Prior to Visit  Medication Sig Dispense Refill  . aspirin EC 81 MG tablet Take 1 tablet (81 mg total) by mouth daily.    Marland Kitchen atorvastatin (LIPITOR) 20 MG tablet Take 0.5 tablets (10 mg total) by mouth daily. 90 tablet 3  . escitalopram (LEXAPRO) 5 MG tablet Take 1 tab once daily for 2 weeks, then 1 tab every other day for 1 week then stop. 21 tablet 0  . nitroGLYCERIN (NITRODUR - DOSED IN MG/24 HR) 0.2 mg/hr patch Apply 1/4th patch to affected shoulder, change daily 30 patch 1  .  [DISCONTINUED] omeprazole (PRILOSEC) 20 MG capsule Take 1 capsule (20 mg total) by mouth daily. 30 capsule 0   No current facility-administered medications on file prior to visit.     Past Surgical History:  Procedure Laterality Date  . CHOLECYSTECTOMY    . FOOT SURGERY Right 2001   toe surgery    No Known Allergies  Social History   Socioeconomic History  . Marital status: Married    Spouse name: Not on file  . Number of children: 2  . Years of education: Not on file  . Highest education level: Not on file  Occupational History  . Occupation: VP Scientist, clinical (histocompatibility and immunogenetics): [employer]    Comment: self employed  Social Needs  . Financial resource strain: Not on file  . Food insecurity:    Worry: Not on file    Inability: Not on file  . Transportation needs:    Medical: Not on file    Non-medical: Not on file  Tobacco Use  . Smoking status: Current Some Day Smoker    Packs/day: 1.00    Types: Cigarettes  . Smokeless tobacco: Never Used  Substance and Sexual Activity  . Alcohol use: Yes    Alcohol/week: 0.0 standard drinks    Comment: social  . Drug use: No  . Sexual activity: Yes  Lifestyle  . Physical activity:    Days per week: Not on file  Minutes per session: Not on file  . Stress: Not on file  Relationships  . Social connections:    Talks on phone: Not on file    Gets together: Not on file    Attends religious service: Not on file    Active member of club or organization: Not on file    Attends meetings of clubs or organizations: Not on file    Relationship status: Not on file  . Intimate partner violence:    Fear of current or ex partner: Not on file    Emotionally abused: Not on file    Physically abused: Not on file    Forced sexual activity: Not on file  Other Topics Concern  . Not on file  Social History Narrative   Occupation: vice Agricultural engineer, security company   Current Smoker-  2 PPD.   Alcohol use-yes,  very rare 1-2 drinks   Regular  exercise-yes   Smoking Status:  current   Packs/Day:  1   Caffeine use/day:  3-4 daily   Does Patient Exercise:  Yes   Married- one biological son, one adopted son, step son    Family History  Problem Relation Age of Onset  . Hyperlipidemia Mother   . Irritable bowel syndrome Mother   . Heart attack Mother   . Arthritis Maternal Grandmother   . Kidney failure Paternal Grandfather   . Alcohol abuse Paternal Grandfather        deceased  . ADD / ADHD Son        adopted  . Alcohol abuse Father   . Kidney Stones Father   . Colon cancer Neg Hx   . Esophageal cancer Neg Hx   . Rectal cancer Neg Hx   . Stomach cancer Neg Hx     BP 120/70   Ht 5\' 11"  (1.803 m)   Wt 193 lb (87.5 kg)   BMI 26.92 kg/m   Review of Systems: See HPI above.     Objective:  Physical Exam:  Gen: NAD, comfortable in exam room  Right shoulder: No swelling, ecchymoses.  No gross deformity. No TTP. FROM with painful arc. Positive Hawkins, negative Neers. Negative Yergasons. Strength 5/5 with empty can and resisted internal/external rotation. Negative apprehension. NV intact distally.   Assessment & Plan:  1. Right shoulder pain - 2/2 rotator cuff impingement.  Not improving with home exercises, nitro patches.  Went ahead with subacromial injection today.  Aleve or ibuprofen if needed.  Consider MRI if not improving.  After informed written consent timeout was performed, patient was seated in chair in exam room. Right shoulder was prepped with alcohol swab and utilizing lateral approach with ultrasound guidance, patient's right subacromial space was injected with 3:1 bupivicaine: depomedrol. Patient tolerated the procedure well without immediate complications.

## 2018-10-16 NOTE — Patient Instructions (Signed)
You have rotator cuff impingement Try to avoid painful activities (overhead activities, lifting with extended arm) as much as possible.  No lifting over 15 pounds next 5-7 days. Aleve 2 tabs twice a day with food OR ibuprofen 3 tabs three times a day with food for pain and inflammation only as needed. Can take tylenol in addition to this. Subacromial injection may be beneficial to help with pain and to decrease inflammation - you were given this today. Consider physical therapy with transition to home exercise program. Do home exercise program with theraband and scapular stabilization exercises daily 3 sets of 10 once a day. Follow up with me in 6 weeks but call me sooner if you're struggling - next step would be to do an MRI.

## 2018-11-04 ENCOUNTER — Encounter: Payer: Self-pay | Admitting: Family

## 2018-11-04 MED ORDER — FLUOXETINE HCL 10 MG PO TABS: ORAL_TABLET | ORAL | 0 refills | Status: DC

## 2018-11-10 ENCOUNTER — Encounter: Payer: Self-pay | Admitting: Family

## 2018-11-27 ENCOUNTER — Other Ambulatory Visit: Payer: Self-pay

## 2018-11-27 ENCOUNTER — Encounter: Payer: Self-pay | Admitting: Family Medicine

## 2018-11-27 ENCOUNTER — Ambulatory Visit: Payer: 59 | Admitting: Family Medicine

## 2018-11-27 VITALS — BP 110/66 | Ht 71.0 in | Wt 185.0 lb

## 2018-11-27 DIAGNOSIS — M7541 Impingement syndrome of right shoulder: Secondary | ICD-10-CM

## 2018-11-27 NOTE — Progress Notes (Signed)
PCP: Debbrah Alar, NP  Subjective:   HPI: Patient is a 53 y.o. male here for follow-up of right shoulder pain.  Patient was last seen 6 weeks ago and diagnosed with right rotator cuff impingement.  At that time he was given a subacromial steroid injection.  He notes following the injection he has had about a 40% improvement in his symptoms.  He has continued to work on home strengthening exercises for his shoulder.  He has also been trying other treatment modalities including acupuncture and therapeutic massage.  He has gotten back into various types of CrossFit exercises including bench press, overhead snatch, power cleans.  Patient notes some weakness with these activities but he is able to do many of them without much discomfort.  The pain in patient's shoulder is located laterally and it does not radiate.  He has an aching quality.  He denies any pain in his neck.  He has no associated numbness or tingling.  He denies any associated skin changes.  Review of Systems: See HPI above.  Past Medical History:  Diagnosis Date  . Anxiety   . GERD (gastroesophageal reflux disease)   . History of chicken pox   . History of kidney stones   . Hyperlipidemia   . Pneumonia     Current Outpatient Medications on File Prior to Visit  Medication Sig Dispense Refill  . aspirin EC 81 MG tablet Take 1 tablet (81 mg total) by mouth daily.    Marland Kitchen atorvastatin (LIPITOR) 20 MG tablet Take 0.5 tablets (10 mg total) by mouth daily. 90 tablet 3  . escitalopram (LEXAPRO) 5 MG tablet Take 1 tab once daily for 2 weeks, then 1 tab every other day for 1 week then stop. 21 tablet 0  . FLUoxetine (PROZAC) 10 MG tablet Take 1 tablet by mouth once daily for 2 weeks, then 1/2 tab once daily for 2 weeks, then 1/2 tab once daily every other day then stop. 30 tablet 0  . nitroGLYCERIN (NITRODUR - DOSED IN MG/24 HR) 0.2 mg/hr patch Apply 1/4th patch to affected shoulder, change daily 30 patch 1  . [DISCONTINUED]  omeprazole (PRILOSEC) 20 MG capsule Take 1 capsule (20 mg total) by mouth daily. 30 capsule 0   No current facility-administered medications on file prior to visit.     Past Surgical History:  Procedure Laterality Date  . CHOLECYSTECTOMY    . FOOT SURGERY Right 2001   toe surgery    No Known Allergies  Social History   Socioeconomic History  . Marital status: Married    Spouse name: Not on file  . Number of children: 2  . Years of education: Not on file  . Highest education level: Not on file  Occupational History  . Occupation: VP Scientist, clinical (histocompatibility and immunogenetics): [employer]    Comment: self employed  Social Needs  . Financial resource strain: Not on file  . Food insecurity    Worry: Not on file    Inability: Not on file  . Transportation needs    Medical: Not on file    Non-medical: Not on file  Tobacco Use  . Smoking status: Current Some Day Smoker    Packs/day: 1.00    Types: Cigarettes  . Smokeless tobacco: Never Used  Substance and Sexual Activity  . Alcohol use: Yes    Alcohol/week: 0.0 standard drinks    Comment: social  . Drug use: No  . Sexual activity: Yes  Lifestyle  . Physical  activity    Days per week: Not on file    Minutes per session: Not on file  . Stress: Not on file  Relationships  . Social Herbalist on phone: Not on file    Gets together: Not on file    Attends religious service: Not on file    Active member of club or organization: Not on file    Attends meetings of clubs or organizations: Not on file    Relationship status: Not on file  . Intimate partner violence    Fear of current or ex partner: Not on file    Emotionally abused: Not on file    Physically abused: Not on file    Forced sexual activity: Not on file  Other Topics Concern  . Not on file  Social History Narrative   Occupation: vice Agricultural engineer, security company   Current Smoker-  2 PPD.   Alcohol use-yes,  very rare 1-2 drinks   Regular exercise-yes    Smoking Status:  current   Packs/Day:  1   Caffeine use/day:  3-4 daily   Does Patient Exercise:  Yes   Married- one biological son, one adopted son, step son    Family History  Problem Relation Age of Onset  . Hyperlipidemia Mother   . Irritable bowel syndrome Mother   . Heart attack Mother   . Arthritis Maternal Grandmother   . Kidney failure Paternal Grandfather   . Alcohol abuse Paternal Grandfather        deceased  . ADD / ADHD Son        adopted  . Alcohol abuse Father   . Kidney Stones Father   . Colon cancer Neg Hx   . Esophageal cancer Neg Hx   . Rectal cancer Neg Hx   . Stomach cancer Neg Hx         Objective:  Physical Exam: BP 110/66   Ht 5\' 11"  (1.803 m)   Wt 185 lb (83.9 kg)   BMI 25.80 kg/m  Gen: NAD, comfortable in exam room Lungs: Breathing comfortably on room air Shoulder Exam right -Inspection: No discoloration, no deformity -Palpation: No tenderness to palpation -ROM (active): Abduction: 180 degrees; Forward Flexion: 180 degrees; Internal Rotation: T10 -ROM (Passive): Abduction: 180 degrees; Forward Flexion: 180 degrees; Internal Rotation: T10 -Strength: Abduction: 5/5; Forward Flexion: 5/5; Internal Rotation: 5/5; External Rotation: 5/5 -Special Tests: Hawkins: positive; Neers: positive; Jobs: Negative; O'briens: Negative; Speeds: Negative; Yergasons: Negative; Cross arm: negative -Limb neurovascularly intact  Contralateral Shoulder -Inspection: No discoloration, no deformity -Palpation: No tenderness to palpation -ROM (active): Abduction: 180 degrees; Forward Flexion: 180 degrees; Internal Rotation: T10 -ROM (Passive): Abduction: 180 degrees; Forward Flexion: 180 degrees; Internal Rotation: T10 -Strength: Abduction: 5/5; Forward Flexion: 5/5; Internal Rotation: 5/5; External Rotation: 5/5 -Limb neurovascularly intact  Cervical Exam:  -Full range of motion with flexion, extension, lateral rotation.    Assessment & Plan:  Patient is a 53  y.o. male here for follow-up of right shoulder pain  1. Right subacromial impingement - Patient with a 40% improvement since his last visit - Exam remains consistent with subacromial impingement.  There is no evidence of a rotator cuff tear or a labral tear on examination - Patient may continue with his home exercises and it is okay to supplement with additional modalities such as therapeutic massage and acupuncture - We discussed getting a possible MRI with the patient however this was declined as it would not change current  management  Patient to follow-up on an as-needed basis

## 2018-11-27 NOTE — Patient Instructions (Signed)
Your shoulder pain is caused by an impingement of your rotator cuff. Your exam does not show any evidence of a rotator cuff tear or of a labral tear. Continue to work on your home stretching and strengthening exercises You may continue to use accessory treatment modalities like acupuncture and massage If your symptoms do not continue to improve or if you have worsening pain or weakness, we can proceed with an MRI for further evaluation

## 2018-11-28 ENCOUNTER — Encounter: Payer: Self-pay | Admitting: Family Medicine

## 2018-12-05 ENCOUNTER — Other Ambulatory Visit: Payer: Self-pay | Admitting: *Deleted

## 2018-12-05 MED ORDER — FLUOXETINE HCL 10 MG PO CAPS: 10.0000 mg | ORAL_CAPSULE | Freq: Every day | ORAL | 0 refills | Status: DC

## 2018-12-05 MED ORDER — FLUOXETINE HCL 10 MG PO TABS: 10.0000 mg | ORAL_TABLET | Freq: Every day | ORAL | 0 refills | Status: DC

## 2018-12-07 ENCOUNTER — Other Ambulatory Visit: Payer: Self-pay | Admitting: Family

## 2018-12-07 ENCOUNTER — Encounter: Payer: Self-pay | Admitting: Family

## 2018-12-09 MED ORDER — FLUOXETINE HCL 10 MG PO CAPS: 10.0000 mg | ORAL_CAPSULE | Freq: Every day | ORAL | 2 refills | Status: DC

## 2019-02-13 ENCOUNTER — Other Ambulatory Visit: Payer: Self-pay | Admitting: *Deleted

## 2019-02-13 MED ORDER — FLUOXETINE HCL 10 MG PO CAPS: 10.0000 mg | ORAL_CAPSULE | Freq: Every day | ORAL | 2 refills | Status: DC

## 2019-03-11 ENCOUNTER — Encounter: Payer: Self-pay | Admitting: Family

## 2019-03-12 NOTE — Telephone Encounter (Signed)
Please contact pt to schedule an office visit for follow up of his anxiety.

## 2019-03-17 ENCOUNTER — Other Ambulatory Visit: Payer: Self-pay

## 2019-03-17 ENCOUNTER — Ambulatory Visit (INDEPENDENT_AMBULATORY_CARE_PROVIDER_SITE_OTHER): Payer: 59 | Admitting: Family

## 2019-03-17 DIAGNOSIS — Z720 Tobacco use: Secondary | ICD-10-CM

## 2019-03-17 DIAGNOSIS — F419 Anxiety disorder, unspecified: Secondary | ICD-10-CM | POA: Diagnosis not present

## 2019-03-17 MED ORDER — FLUOXETINE HCL 20 MG PO TABS: 20.0000 mg | ORAL_TABLET | Freq: Every day | ORAL | 0 refills | Status: DC

## 2019-03-17 NOTE — Progress Notes (Signed)
Virtual Visit via Video Note  I connected with Raini Ritthaler on 03/17/19 at 10:40 AM EST by a video enabled telemedicine application and verified that I am speaking with the correct person using two identifiers.  Location: Patient: car Provider: home   I discussed the limitations of evaluation and management by telemedicine and the availability of in person appointments. The patient expressed understanding and agreed to proceed. History of Present Illness:   GAD 7 : Generalized Anxiety Score 03/17/2019  Nervous, Anxious, on Edge 1  Control/stop worrying 1  Worry too much - different things 1  Trouble relaxing 2  Restless 0  Easily annoyed or irritable 1  Afraid - awful might happen 1  Total GAD 7 Score 7  Anxiety Difficulty Not difficult at all   Past Medical History:  Diagnosis Date  . Anxiety   . GERD (gastroesophageal reflux disease)   . History of chicken pox   . History of kidney stones   . Hyperlipidemia   . Pneumonia      Social History   Socioeconomic History  . Marital status: Married    Spouse name: Not on file  . Number of children: 2  . Years of education: Not on file  . Highest education level: Not on file  Occupational History  . Occupation: VP Scientist, clinical (histocompatibility and immunogenetics): [employer]    Comment: self employed  Social Needs  . Financial resource strain: Not on file  . Food insecurity    Worry: Not on file    Inability: Not on file  . Transportation needs    Medical: Not on file    Non-medical: Not on file  Tobacco Use  . Smoking status: Current Some Day Smoker    Packs/day: 1.00    Types: Cigarettes  . Smokeless tobacco: Never Used  Substance and Sexual Activity  . Alcohol use: Yes    Alcohol/week: 0.0 standard drinks    Comment: social  . Drug use: No  . Sexual activity: Yes  Lifestyle  . Physical activity    Days per week: Not on file    Minutes per session: Not on file  . Stress: Not on file  Relationships  . Social Clinical research associate on phone: Not on file    Gets together: Not on file    Attends religious service: Not on file    Active member of club or organization: Not on file    Attends meetings of clubs or organizations: Not on file    Relationship status: Not on file  . Intimate partner violence    Fear of current or ex partner: Not on file    Emotionally abused: Not on file    Physically abused: Not on file    Forced sexual activity: Not on file  Other Topics Concern  . Not on file  Social History Narrative   Occupation: vice Agricultural engineer, security company   Current Smoker-  2 PPD.   Alcohol use-yes,  very rare 1-2 drinks   Regular exercise-yes   Smoking Status:  current   Packs/Day:  1   Caffeine use/day:  3-4 daily   Does Patient Exercise:  Yes   Married- one biological son, one adopted son, step son    Past Surgical History:  Procedure Laterality Date  . CHOLECYSTECTOMY    . FOOT SURGERY Right 2001   toe surgery    Family History  Problem Relation Age of Onset  . Hyperlipidemia Mother   .  Irritable bowel syndrome Mother   . Heart attack Mother   . Arthritis Maternal Grandmother   . Kidney failure Paternal Grandfather   . Alcohol abuse Paternal Grandfather        deceased  . ADD / ADHD Son        adopted  . Alcohol abuse Father   . Kidney Stones Father   . Colon cancer Neg Hx   . Esophageal cancer Neg Hx   . Rectal cancer Neg Hx   . Stomach cancer Neg Hx     No Known Allergies  Current Outpatient Medications on File Prior to Visit  Medication Sig Dispense Refill  . aspirin EC 81 MG tablet Take 1 tablet (81 mg total) by mouth daily.    Marland Kitchen atorvastatin (LIPITOR) 20 MG tablet Take 0.5 tablets (10 mg total) by mouth daily. 90 tablet 3  . escitalopram (LEXAPRO) 5 MG tablet Take 1 tab once daily for 2 weeks, then 1 tab every other day for 1 week then stop. 21 tablet 0  . nitroGLYCERIN (NITRODUR - DOSED IN MG/24 HR) 0.2 mg/hr patch Apply 1/4th patch to affected shoulder, change  daily 30 patch 1  . [DISCONTINUED] omeprazole (PRILOSEC) 20 MG capsule Take 1 capsule (20 mg total) by mouth daily. 30 capsule 0   No current facility-administered medications on file prior to visit.     There were no vitals taken for this visit.       Observations/Objective:   Gen: Awake, alert, no acute distress Resp: Breathing is even and non-labored Psych: calm/pleasant demeanor Neuro: Alert and Oriented x 3, + facial symmetry, speech is clear.   Assessment and Plan:  Anxiety-uncontrolled. Will increase prozac from 10mg  to 20mg .    Tobacco abuse- counseled pt on quitting. Discussed chantix as an aid to help him. He is not currently ready to quit, but I discussed with him to let me know when he is ready.   Follow Up Instructions:    I discussed the assessment and treatment plan with the patient. The patient was provided an opportunity to ask questions and all were answered. The patient agreed with the plan and demonstrated an understanding of the instructions.   The patient was advised to call back or seek an in-person evaluation if the symptoms worsen or if the condition fails to improve as anticipated.  [name], NP

## 2019-05-05 ENCOUNTER — Other Ambulatory Visit: Payer: Self-pay

## 2019-05-05 ENCOUNTER — Other Ambulatory Visit: Payer: Self-pay | Admitting: Physician Assistant

## 2019-05-05 MED ORDER — PANTOPRAZOLE SODIUM 40 MG PO TBEC: DELAYED_RELEASE_TABLET | ORAL | 0 refills | Status: DC

## 2019-06-11 ENCOUNTER — Other Ambulatory Visit: Payer: Self-pay

## 2019-06-11 MED ORDER — FLUOXETINE HCL 20 MG PO TABS: 20.0000 mg | ORAL_TABLET | Freq: Every day | ORAL | 0 refills | Status: DC

## 2019-08-07 ENCOUNTER — Encounter: Payer: Self-pay | Admitting: Family

## 2019-08-07 MED ORDER — ATORVASTATIN CALCIUM 20 MG PO TABS: 10.0000 mg | ORAL_TABLET | Freq: Every day | ORAL | 1 refills | Status: DC

## 2019-08-21 ENCOUNTER — Encounter: Payer: Self-pay | Admitting: Family

## 2019-08-22 MED ORDER — ATORVASTATIN CALCIUM 20 MG PO TABS: 20.0000 mg | ORAL_TABLET | Freq: Every day | ORAL | 1 refills | Status: DC

## 2019-09-03 ENCOUNTER — Ambulatory Visit (INDEPENDENT_AMBULATORY_CARE_PROVIDER_SITE_OTHER): Payer: BC Managed Care – PPO | Admitting: Family

## 2019-09-03 ENCOUNTER — Encounter: Payer: Self-pay | Admitting: Family

## 2019-09-03 ENCOUNTER — Other Ambulatory Visit: Payer: Self-pay

## 2019-09-03 VITALS — BP 117/65 | HR 81 | Temp 98.4°F | Resp 18 | Ht 71.0 in | Wt 195.0 lb

## 2019-09-03 DIAGNOSIS — R5383 Other fatigue: Secondary | ICD-10-CM

## 2019-09-03 DIAGNOSIS — Z0000 Encounter for general adult medical examination without abnormal findings: Secondary | ICD-10-CM | POA: Diagnosis not present

## 2019-09-03 DIAGNOSIS — Z23 Encounter for immunization: Secondary | ICD-10-CM

## 2019-09-03 DIAGNOSIS — E785 Hyperlipidemia, unspecified: Secondary | ICD-10-CM

## 2019-09-03 LAB — LIPID PANEL
Cholesterol: 172 mg/dL (ref 0–200)
HDL: 40.1 mg/dL (ref >39.00)
LDL Cholesterol: 117 mg/dL — ABNORMAL HIGH (ref 0–99)
NonHDL: 131.58
Total CHOL/HDL Ratio: 4
Triglycerides: 72 mg/dL (ref 0.0–149.0)
VLDL: 14.4 mg/dL (ref 0.0–40.0)

## 2019-09-03 LAB — COMPREHENSIVE METABOLIC PANEL
ALT: 46 U/L (ref 0–53)
AST: 27 U/L (ref 0–37)
Albumin: 4.3 g/dL (ref 3.5–5.2)
Alkaline Phosphatase: 95 U/L (ref 39–117)
BUN: 25 mg/dL — ABNORMAL HIGH (ref 6–23)
CO2: 28 mEq/L (ref 19–32)
Calcium: 9.6 mg/dL (ref 8.4–10.5)
Chloride: 105 mEq/L (ref 96–112)
Creatinine, Ser: 0.99 mg/dL (ref 0.40–1.50)
GFR: 79.37 mL/min (ref >60.00)
Glucose, Bld: 106 mg/dL — ABNORMAL HIGH (ref 70–99)
Potassium: 4.5 mEq/L (ref 3.5–5.1)
Sodium: 140 mEq/L (ref 135–145)
Total Bilirubin: 0.4 mg/dL (ref 0.2–1.2)
Total Protein: 6.5 g/dL (ref 6.0–8.3)

## 2019-09-03 LAB — TSH: TSH: 0.91 u[IU]/mL (ref 0.35–4.50)

## 2019-09-03 NOTE — Patient Instructions (Signed)
Please complete lab work prior to leaving.  Preventive Care 40-52 Years Old, Male Preventive care refers to lifestyle choices and visits with your health care provider that can promote health and wellness. This includes:  A yearly physical exam. This is also called an annual well check.  Regular dental and eye exams.  Immunizations.  Screening for certain conditions.  Healthy lifestyle choices, such as eating a healthy diet, getting regular exercise, not using drugs or products that contain nicotine and tobacco, and limiting alcohol use. What can I expect for my preventive care visit? Physical exam Your health care provider will check:  Height and weight. These may be used to calculate body mass index (BMI), which is a measurement that tells if you are at a healthy weight.  Heart rate and blood pressure.  Your skin for abnormal spots. Counseling Your health care provider may ask you questions about:  Alcohol, tobacco, and drug use.  Emotional well-being.  Home and relationship well-being.  Sexual activity.  Eating habits.  Work and work environment. What immunizations do I need?  Influenza (flu) vaccine  This is recommended every year. Tetanus, diphtheria, and pertussis (Tdap) vaccine  You may need a Td booster every 10 years. Varicella (chickenpox) vaccine  You may need this vaccine if you have not already been vaccinated. Zoster (shingles) vaccine  You may need this after age 60. Measles, mumps, and rubella (MMR) vaccine  You may need at least one dose of MMR if you were born in 1957 or later. You may also need a second dose. Pneumococcal conjugate (PCV13) vaccine  You may need this if you have certain conditions and were not previously vaccinated. Pneumococcal polysaccharide (PPSV23) vaccine  You may need one or two doses if you smoke cigarettes or if you have certain conditions. Meningococcal conjugate (MenACWY) vaccine  You may need this if you have  certain conditions. Hepatitis A vaccine  You may need this if you have certain conditions or if you travel or work in places where you may be exposed to hepatitis A. Hepatitis B vaccine  You may need this if you have certain conditions or if you travel or work in places where you may be exposed to hepatitis B. Haemophilus influenzae type b (Hib) vaccine  You may need this if you have certain risk factors. Human papillomavirus (HPV) vaccine  If recommended by your health care provider, you may need three doses over 6 months. You may receive vaccines as individual doses or as more than one vaccine together in one shot (combination vaccines). Talk with your health care provider about the risks and benefits of combination vaccines. What tests do I need? Blood tests  Lipid and cholesterol levels. These may be checked every 5 years, or more frequently if you are over 50 years old.  Hepatitis C test.  Hepatitis B test. Screening  Lung cancer screening. You may have this screening every year starting at age 55 if you have a 30-pack-year history of smoking and currently smoke or have quit within the past 15 years.  Prostate cancer screening. Recommendations will vary depending on your family history and other risks.  Colorectal cancer screening. All adults should have this screening starting at age 50 and continuing until age 75. Your health care provider may recommend screening at age 45 if you are at increased risk. You will have tests every 1-10 years, depending on your results and the type of screening test.  Diabetes screening. This is done by checking your   blood sugar (glucose) after you have not eaten for a while (fasting). You may have this done every 1-3 years.  Sexually transmitted disease (STD) testing. Follow these instructions at home: Eating and drinking  Eat a diet that includes fresh fruits and vegetables, whole grains, lean protein, and low-fat dairy products.  Take  vitamin and mineral supplements as recommended by your health care provider.  Do not drink alcohol if your health care provider tells you not to drink.  If you drink alcohol: ? Limit how much you have to 0-2 drinks a day. ? Be aware of how much alcohol is in your drink. In the [place] one drink equals one 12 oz bottle of beer (355 mL), one 5 oz glass of wine (148 mL), or one 1 oz glass of hard liquor (44 mL). Lifestyle  Take daily care of your teeth and gums.  Stay active. Exercise for at least 30 minutes on 5 or more days each week.  Do not use any products that contain nicotine or tobacco, such as cigarettes, e-cigarettes, and chewing tobacco. If you need help quitting, ask your health care provider.  If you are sexually active, practice safe sex. Use a condom or other form of protection to prevent STIs (sexually transmitted infections).  Talk with your health care provider about taking a low-dose aspirin every day starting at age 50. What's next?  Go to your health care provider once a year for a well check visit.  Ask your health care provider how often you should have your eyes and teeth checked.  Stay up to date on all vaccines. This information is not intended to replace advice given to you by your health care provider. Make sure you discuss any questions you have with your health care provider. Document Revised: 04/18/2018 Document Reviewed: 04/18/2018 Elsevier Patient Education  2020 Elsevier Inc.  

## 2019-09-03 NOTE — Progress Notes (Signed)
Subjective:    Patient ID: Jonathan Powell, male    DOB: January 09, 1966, 54 y.o.   MRN: [id number]  HPI  Patient presents today for complete physical.  Immunizations: had one shingrix, due for Shingrix #2, Tetanus 2016 Diet: reports that he eats healthy, snacks well.   Quit smoking in January- now vaping.   Exercise: 2 hrs/day five days a week Colonoscopy: 09/27/18 Vision:   Dental:  PSA:  Lab Results  Component Value Date   PSA 1.1 07/19/2018   PSA 1.1 06/29/2017   PSA 1.21 10/23/2016   Wt Readings from Last 3 Encounters:  09/03/19 195 lb (88.5 kg)  11/27/18 185 lb (83.9 kg)  10/16/18 193 lb (87.5 kg)   Reports feeling more lethargic lately.   Started taking coenzyme Q10.      Review of Systems  Constitutional: Negative for unexpected weight change.  HENT: Positive for tinnitus (right ear x 1 year). Negative for hearing loss and rhinorrhea.   Eyes: Negative for visual disturbance.  Respiratory: Negative for cough and shortness of breath.   Cardiovascular: Negative for chest pain.  Gastrointestinal: Negative for blood in stool, constipation and diarrhea.  Genitourinary: Negative for difficulty urinating.  Musculoskeletal: Negative for arthralgias and myalgias.  Skin: Negative for rash.  Neurological: Positive for headaches (some mild eye strain HA's).  Psychiatric/Behavioral:       Denies depression   Past Medical History:  Diagnosis Date  . Anxiety   . GERD (gastroesophageal reflux disease)   . History of chicken pox   . History of kidney stones   . Hyperlipidemia   . Pneumonia      Social History   Socioeconomic History  . Marital status: Married    Spouse name: Not on file  . Number of children: 2  . Years of education: Not on file  . Highest education level: Not on file  Occupational History  . Occupation: VP Scientist, clinical (histocompatibility and immunogenetics): [employer]    Comment: self employed  Tobacco Use  . Smoking status: Current Some Day Smoker    Packs/day:  1.00    Types: Cigarettes  . Smokeless tobacco: Never Used  Substance and Sexual Activity  . Alcohol use: Yes    Alcohol/week: 0.0 standard drinks    Comment: social  . Drug use: No  . Sexual activity: Yes  Other Topics Concern  . Not on file  Social History Narrative   Occupation: vice Agricultural engineer, security company   Current Smoker-  2 PPD.   Alcohol use-yes,  very rare 1-2 drinks   Regular exercise-yes   Smoking Status:  current   Packs/Day:  1   Caffeine use/day:  3-4 daily   Does Patient Exercise:  Yes   Married- one biological son, one adopted son, step son   Social Determinants of Radio broadcast assistant Strain:   . Difficulty of Paying Living Expenses:   Food Insecurity:   . Worried About Charity fundraiser in the Last Year:   . Arboriculturist in the Last Year:   Transportation Needs:   . Film/video editor (Medical):   Marland Kitchen Lack of Transportation (Non-Medical):   Physical Activity:   . Days of Exercise per Week:   . Minutes of Exercise per Session:   Stress:   . Feeling of Stress :   Social Connections:   . Frequency of Communication with Friends and Family:   . Frequency of Social Gatherings with Friends  and Family:   . Attends Religious Services:   . Active Member of Clubs or Organizations:   . Attends Archivist Meetings:   Marland Kitchen Marital Status:   Intimate Partner Violence:   . Fear of Current or Ex-Partner:   . Emotionally Abused:   Marland Kitchen Physically Abused:   . Sexually Abused:     Past Surgical History:  Procedure Laterality Date  . CHOLECYSTECTOMY    . FOOT SURGERY Right 2001   toe surgery    Family History  Problem Relation Age of Onset  . Hyperlipidemia Mother   . Irritable bowel syndrome Mother   . Heart attack Mother   . Arthritis Maternal Grandmother   . Kidney failure Paternal Grandfather   . Alcohol abuse Paternal Grandfather        deceased  . ADD / ADHD Son        adopted  . Alcohol abuse Father   . Kidney  Stones Father   . Colon cancer Neg Hx   . Esophageal cancer Neg Hx   . Rectal cancer Neg Hx   . Stomach cancer Neg Hx     No Known Allergies  Current Outpatient Medications on File Prior to Visit  Medication Sig Dispense Refill  . aspirin EC 81 MG tablet Take 1 tablet (81 mg total) by mouth daily.    Marland Kitchen atorvastatin (LIPITOR) 20 MG tablet Take 1 tablet (20 mg total) by mouth daily. 90 tablet 1  . FLUoxetine (PROZAC) 20 MG tablet Take 1 tablet (20 mg total) by mouth daily. 90 tablet 0  . pantoprazole (PROTONIX) 40 MG tablet Please schedule an Office Visit for further refills. Last seen 07-2017 60 tablet 0  . [DISCONTINUED] omeprazole (PRILOSEC) 20 MG capsule Take 1 capsule (20 mg total) by mouth daily. 30 capsule 0   No current facility-administered medications on file prior to visit.    BP 117/65 (BP Location: Right Arm, Patient Position: Sitting, Cuff Size: Large)   Pulse 81   Temp 98.4 F (36.9 C) (Temporal)   Resp 18   Ht 5\' 11"  (1.803 m)   Wt 195 lb (88.5 kg)   SpO2 99%   BMI 27.20 kg/m       Objective:   Physical Exam  Physical Exam  Constitutional: He is oriented to person, place, and time. He appears well-developed and well-nourished. No distress.  HENT:  Head: Normocephalic and atraumatic.  Right Ear: Tympanic membrane and ear canal normal.  Left Ear: Tympanic membrane and ear canal normal.  Mouth/Throat: not examined- pt wearing mask Eyes: Pupils are equal, round, and reactive to light. No scleral icterus.  Neck: Normal range of motion. No thyromegaly present.  Cardiovascular: Normal rate and regular rhythm.   No murmur heard. Pulmonary/Chest: Effort normal and breath sounds normal. No respiratory distress. He has no wheezes. He has no rales. He exhibits no tenderness.  Abdominal: Soft. Bowel sounds are normal. He exhibits no distension and no mass. There is no tenderness. There is no rebound and no guarding.  Musculoskeletal: He exhibits no edema.    Lymphadenopathy:    He has no cervical adenopathy.  Neurological: He is alert and oriented to person, place, and time. He has normal patellar reflexes. He exhibits normal muscle tone. Coordination normal.  Skin: Skin is warm and dry.  Psychiatric: He has a normal mood and affect. His behavior is normal. Judgment and thought content normal.           Assessment & Plan:  Preventative care- encouraged pt to work on eliminating late night snacks to facilitate some weight loss. Continue regular exercise.  Obtain routine lab work including a Testosterone level due to his c/o fatigue. Shingrix #2 today.   This visit occurred during the SARS-CoV-2 public health emergency.  Safety protocols were in place, including screening questions prior to the visit, additional usage of staff PPE, and extensive cleaning of exam room while observing appropriate contact time as indicated for disinfecting solutions.          Assessment & Plan:

## 2019-09-04 ENCOUNTER — Other Ambulatory Visit: Payer: BC Managed Care – PPO

## 2019-09-04 LAB — CBC WITH DIFFERENTIAL/PLATELET
Basophils Absolute: 0.1 10*3/uL (ref 0.0–0.1)
Basophils Relative: 1.2 % (ref 0.0–3.0)
Eosinophils Absolute: 0 10*3/uL (ref 0.0–0.7)
Eosinophils Relative: 0.5 % (ref 0.0–5.0)
HCT: 42.6 % (ref 39.0–52.0)
Hemoglobin: 13.9 g/dL (ref 13.0–17.0)
Lymphocytes Relative: 20.2 % (ref 12.0–46.0)
Lymphs Abs: 1.7 10*3/uL (ref 0.7–4.0)
MCHC: 32.7 g/dL (ref 30.0–36.0)
MCV: 92.6 fl (ref 78.0–100.0)
Monocytes Absolute: 0.6 10*3/uL (ref 0.1–1.0)
Monocytes Relative: 7.2 % (ref 3.0–12.0)
Neutro Abs: 6.1 10*3/uL (ref 1.4–7.7)
Neutrophils Relative %: 70.9 % (ref 43.0–77.0)
Platelets: 266 10*3/uL (ref 150.0–400.0)
RBC: 4.6 Mil/uL (ref 4.22–5.81)
RDW: 12.8 % (ref 11.5–15.5)
WBC: 8.5 10*3/uL (ref 4.0–10.5)

## 2019-09-04 LAB — TESTOSTERONE TOTAL,FREE,BIO, MALES
Albumin: 4.1 g/dL (ref 3.6–5.1)
Sex Hormone Binding: 37 nmol/L (ref 10–50)
Testosterone, Bioavailable: 90.7 ng/dL — ABNORMAL LOW (ref >=110.0)
Testosterone, Free: 48.2 pg/mL (ref 46.0–224.0)
Testosterone: 396 ng/dL (ref 250–827)

## 2019-09-04 NOTE — Progress Notes (Signed)
Pt here for redraw per direction of [facility] lab. Draw lavender and light blue tube. No charge today.

## 2019-09-05 ENCOUNTER — Encounter: Payer: Self-pay | Admitting: Family

## 2019-09-16 ENCOUNTER — Encounter: Payer: Self-pay | Admitting: Family

## 2019-09-16 DIAGNOSIS — R5383 Other fatigue: Secondary | ICD-10-CM

## 2019-09-23 ENCOUNTER — Encounter: Payer: Self-pay | Admitting: Family

## 2019-09-24 MED ORDER — FLUOXETINE HCL 20 MG PO TABS: 20.0000 mg | ORAL_TABLET | Freq: Every day | ORAL | 1 refills | Status: DC

## 2019-10-08 ENCOUNTER — Other Ambulatory Visit: Payer: Self-pay

## 2019-10-08 MED ORDER — FLUOXETINE HCL 20 MG PO TABS: 20.0000 mg | ORAL_TABLET | Freq: Every day | ORAL | 1 refills | Status: DC

## 2020-01-01 ENCOUNTER — Telehealth: Payer: Self-pay | Admitting: Family

## 2020-01-01 NOTE — Telephone Encounter (Signed)
Medication:  FLUoxetine (PROZAC) 20 MG tablet [[id number]]   Has the patient contacted their pharmacy? No. (If no, request that the patient contact the pharmacy for the refill.) (If yes, when and what did the pharmacy advise?)  Preferred Pharmacy (with phone number or street name):  [facility], [place]  [address] [place] [address]  Phone:  [phone] Fax:  [phone]  DEA #:  -- Agent: Please be advised that RX refills may take up to 3 business days. We ask that you follow-up with your pharmacy.

## 2020-01-01 NOTE — Telephone Encounter (Signed)
Lvm for patient to be aware he still has a refill on the medication. [facility] and they will get this ready for him/

## 2020-01-05 ENCOUNTER — Telehealth: Payer: Self-pay | Admitting: Family

## 2020-01-05 NOTE — Telephone Encounter (Signed)
Medication: FLUoxetine (PROZAC) 20 MG tablet   Has the patient contacted their pharmacy? No. (If no, request that the patient contact the pharmacy for the refill.) (If yes, when and what did the pharmacy advise?)  Preferred Pharmacy (with phone number or street name):  [facility], [place] Phone:  [phone]  Fax:  [phone]       Agent: Please be advised that RX refills may take up to 3 business days. We ask that you follow-up with your pharmacy.

## 2020-01-05 NOTE — Telephone Encounter (Signed)
Lvm for patient to be aware per [facility], his medication is on the way,they said it was shipped last week.

## 2020-01-08 ENCOUNTER — Other Ambulatory Visit: Payer: Self-pay | Admitting: Family

## 2020-01-29 DIAGNOSIS — Z20822 Contact with and (suspected) exposure to covid-19: Secondary | ICD-10-CM | POA: Diagnosis not present

## 2020-03-17 DIAGNOSIS — Z20822 Contact with and (suspected) exposure to covid-19: Secondary | ICD-10-CM | POA: Diagnosis not present

## 2020-05-07 DIAGNOSIS — Z20822 Contact with and (suspected) exposure to covid-19: Secondary | ICD-10-CM | POA: Diagnosis not present

## 2020-05-07 DIAGNOSIS — Z03818 Encounter for observation for suspected exposure to other biological agents ruled out: Secondary | ICD-10-CM | POA: Diagnosis not present

## 2020-05-26 DIAGNOSIS — M9903 Segmental and somatic dysfunction of lumbar region: Secondary | ICD-10-CM | POA: Diagnosis not present

## 2020-05-26 DIAGNOSIS — M531 Cervicobrachial syndrome: Secondary | ICD-10-CM | POA: Diagnosis not present

## 2020-05-26 DIAGNOSIS — M9902 Segmental and somatic dysfunction of thoracic region: Secondary | ICD-10-CM | POA: Diagnosis not present

## 2020-05-26 DIAGNOSIS — M9901 Segmental and somatic dysfunction of cervical region: Secondary | ICD-10-CM | POA: Diagnosis not present

## 2020-06-01 ENCOUNTER — Encounter: Payer: Self-pay | Admitting: Family

## 2020-06-09 ENCOUNTER — Telehealth: Payer: Self-pay | Admitting: Family

## 2020-06-09 ENCOUNTER — Other Ambulatory Visit: Payer: Self-pay

## 2020-06-09 MED ORDER — FLUOXETINE HCL 20 MG PO TABS: 20.0000 mg | ORAL_TABLET | Freq: Every day | ORAL | 0 refills | Status: DC

## 2020-06-09 NOTE — Telephone Encounter (Signed)
Medication:  FLUoxetine (PROZAC) 20 MG tablet [[id number]]   Has the patient contacted their pharmacy? No. (If no, request that the patient contact the pharmacy for the refill.) (If yes, when and what did the pharmacy advise?)  Preferred Pharmacy (with phone number or street name):  [id number] - [place], [place] - [address]  [address], [place] [address]  Phone:  [phone] Fax:  [phone]  DEA #:  -- Agent: Please be advised that RX refills may take up to 3 business days. We ask that you follow-up with your pharmacy.   

## 2020-06-09 NOTE — Telephone Encounter (Signed)
rx sent for 30 days pharmacy to advise he needs f/up

## 2020-07-02 ENCOUNTER — Other Ambulatory Visit: Payer: Self-pay | Admitting: Family

## 2020-07-02 MED ORDER — FLUOXETINE HCL 20 MG PO TABS: 20.0000 mg | ORAL_TABLET | Freq: Every day | ORAL | 1 refills | Status: DC

## 2020-07-08 ENCOUNTER — Telehealth: Payer: Self-pay | Admitting: Family

## 2020-07-08 NOTE — Telephone Encounter (Signed)
Insurance does not covered tablet...please change to capsule  FLUoxetine (PROZAC) 20 [facility]   [facility], [place]  [address] [place] [address]  Phone:  [phone] Fax:  [phone]

## 2020-07-09 ENCOUNTER — Other Ambulatory Visit: Payer: Self-pay

## 2020-07-09 MED ORDER — FLUOXETINE HCL 20 MG PO CAPS: 20.0000 mg | ORAL_CAPSULE | Freq: Every day | ORAL | 3 refills | Status: DC

## 2020-07-09 NOTE — Telephone Encounter (Signed)
Ok to change please.

## 2020-07-09 NOTE — Telephone Encounter (Signed)
Prescription sent to pharmacy as 20 mg capsules, with note to cancel previous rx for 20 mg tablets

## 2020-08-20 DIAGNOSIS — M9903 Segmental and somatic dysfunction of lumbar region: Secondary | ICD-10-CM | POA: Diagnosis not present

## 2020-08-20 DIAGNOSIS — M531 Cervicobrachial syndrome: Secondary | ICD-10-CM | POA: Diagnosis not present

## 2020-08-20 DIAGNOSIS — M9901 Segmental and somatic dysfunction of cervical region: Secondary | ICD-10-CM | POA: Diagnosis not present

## 2020-08-20 DIAGNOSIS — M9902 Segmental and somatic dysfunction of thoracic region: Secondary | ICD-10-CM | POA: Diagnosis not present

## 2020-08-26 DIAGNOSIS — M9902 Segmental and somatic dysfunction of thoracic region: Secondary | ICD-10-CM | POA: Diagnosis not present

## 2020-08-26 DIAGNOSIS — M9903 Segmental and somatic dysfunction of lumbar region: Secondary | ICD-10-CM | POA: Diagnosis not present

## 2020-08-26 DIAGNOSIS — M9901 Segmental and somatic dysfunction of cervical region: Secondary | ICD-10-CM | POA: Diagnosis not present

## 2020-08-26 DIAGNOSIS — M531 Cervicobrachial syndrome: Secondary | ICD-10-CM | POA: Diagnosis not present

## 2020-09-02 DIAGNOSIS — M531 Cervicobrachial syndrome: Secondary | ICD-10-CM | POA: Diagnosis not present

## 2020-09-02 DIAGNOSIS — M9902 Segmental and somatic dysfunction of thoracic region: Secondary | ICD-10-CM | POA: Diagnosis not present

## 2020-09-02 DIAGNOSIS — M9903 Segmental and somatic dysfunction of lumbar region: Secondary | ICD-10-CM | POA: Diagnosis not present

## 2020-09-02 DIAGNOSIS — M9901 Segmental and somatic dysfunction of cervical region: Secondary | ICD-10-CM | POA: Diagnosis not present

## 2020-09-09 DIAGNOSIS — M9903 Segmental and somatic dysfunction of lumbar region: Secondary | ICD-10-CM | POA: Diagnosis not present

## 2020-09-09 DIAGNOSIS — M531 Cervicobrachial syndrome: Secondary | ICD-10-CM | POA: Diagnosis not present

## 2020-09-09 DIAGNOSIS — M9901 Segmental and somatic dysfunction of cervical region: Secondary | ICD-10-CM | POA: Diagnosis not present

## 2020-09-09 DIAGNOSIS — M9902 Segmental and somatic dysfunction of thoracic region: Secondary | ICD-10-CM | POA: Diagnosis not present

## 2020-09-16 DIAGNOSIS — M9901 Segmental and somatic dysfunction of cervical region: Secondary | ICD-10-CM | POA: Diagnosis not present

## 2020-09-16 DIAGNOSIS — M531 Cervicobrachial syndrome: Secondary | ICD-10-CM | POA: Diagnosis not present

## 2020-09-16 DIAGNOSIS — M9903 Segmental and somatic dysfunction of lumbar region: Secondary | ICD-10-CM | POA: Diagnosis not present

## 2020-09-16 DIAGNOSIS — M9902 Segmental and somatic dysfunction of thoracic region: Secondary | ICD-10-CM | POA: Diagnosis not present

## 2020-09-23 DIAGNOSIS — M9902 Segmental and somatic dysfunction of thoracic region: Secondary | ICD-10-CM | POA: Diagnosis not present

## 2020-09-23 DIAGNOSIS — M9903 Segmental and somatic dysfunction of lumbar region: Secondary | ICD-10-CM | POA: Diagnosis not present

## 2020-09-23 DIAGNOSIS — M531 Cervicobrachial syndrome: Secondary | ICD-10-CM | POA: Diagnosis not present

## 2020-09-23 DIAGNOSIS — M9901 Segmental and somatic dysfunction of cervical region: Secondary | ICD-10-CM | POA: Diagnosis not present

## 2020-10-01 DIAGNOSIS — M9903 Segmental and somatic dysfunction of lumbar region: Secondary | ICD-10-CM | POA: Diagnosis not present

## 2020-10-01 DIAGNOSIS — M9902 Segmental and somatic dysfunction of thoracic region: Secondary | ICD-10-CM | POA: Diagnosis not present

## 2020-10-01 DIAGNOSIS — M531 Cervicobrachial syndrome: Secondary | ICD-10-CM | POA: Diagnosis not present

## 2020-10-01 DIAGNOSIS — M9901 Segmental and somatic dysfunction of cervical region: Secondary | ICD-10-CM | POA: Diagnosis not present

## 2020-10-14 DIAGNOSIS — M9902 Segmental and somatic dysfunction of thoracic region: Secondary | ICD-10-CM | POA: Diagnosis not present

## 2020-10-14 DIAGNOSIS — M9901 Segmental and somatic dysfunction of cervical region: Secondary | ICD-10-CM | POA: Diagnosis not present

## 2020-10-14 DIAGNOSIS — M531 Cervicobrachial syndrome: Secondary | ICD-10-CM | POA: Diagnosis not present

## 2020-10-14 DIAGNOSIS — M9903 Segmental and somatic dysfunction of lumbar region: Secondary | ICD-10-CM | POA: Diagnosis not present

## 2020-10-21 DIAGNOSIS — M9901 Segmental and somatic dysfunction of cervical region: Secondary | ICD-10-CM | POA: Diagnosis not present

## 2020-10-21 DIAGNOSIS — M531 Cervicobrachial syndrome: Secondary | ICD-10-CM | POA: Diagnosis not present

## 2020-10-21 DIAGNOSIS — M9902 Segmental and somatic dysfunction of thoracic region: Secondary | ICD-10-CM | POA: Diagnosis not present

## 2020-10-21 DIAGNOSIS — M9903 Segmental and somatic dysfunction of lumbar region: Secondary | ICD-10-CM | POA: Diagnosis not present

## 2020-11-04 DIAGNOSIS — M9902 Segmental and somatic dysfunction of thoracic region: Secondary | ICD-10-CM | POA: Diagnosis not present

## 2020-11-04 DIAGNOSIS — M9901 Segmental and somatic dysfunction of cervical region: Secondary | ICD-10-CM | POA: Diagnosis not present

## 2020-11-04 DIAGNOSIS — M9903 Segmental and somatic dysfunction of lumbar region: Secondary | ICD-10-CM | POA: Diagnosis not present

## 2020-11-04 DIAGNOSIS — M531 Cervicobrachial syndrome: Secondary | ICD-10-CM | POA: Diagnosis not present

## 2020-11-07 ENCOUNTER — Encounter: Payer: Self-pay | Admitting: Family

## 2020-11-11 DIAGNOSIS — M9903 Segmental and somatic dysfunction of lumbar region: Secondary | ICD-10-CM | POA: Diagnosis not present

## 2020-11-11 DIAGNOSIS — M9901 Segmental and somatic dysfunction of cervical region: Secondary | ICD-10-CM | POA: Diagnosis not present

## 2020-11-11 DIAGNOSIS — M531 Cervicobrachial syndrome: Secondary | ICD-10-CM | POA: Diagnosis not present

## 2020-11-11 DIAGNOSIS — M9902 Segmental and somatic dysfunction of thoracic region: Secondary | ICD-10-CM | POA: Diagnosis not present

## 2021-01-11 ENCOUNTER — Ambulatory Visit (INDEPENDENT_AMBULATORY_CARE_PROVIDER_SITE_OTHER): Payer: BC Managed Care – PPO | Admitting: Family

## 2021-01-11 ENCOUNTER — Other Ambulatory Visit: Payer: Self-pay

## 2021-01-11 ENCOUNTER — Encounter: Payer: Self-pay | Admitting: Family

## 2021-01-11 VITALS — BP 128/77 | HR 65 | Temp 98.5°F | Resp 16 | Wt 184.0 lb

## 2021-01-11 DIAGNOSIS — L723 Sebaceous cyst: Secondary | ICD-10-CM | POA: Diagnosis not present

## 2021-01-11 DIAGNOSIS — R739 Hyperglycemia, unspecified: Secondary | ICD-10-CM | POA: Diagnosis not present

## 2021-01-11 DIAGNOSIS — Z0000 Encounter for general adult medical examination without abnormal findings: Secondary | ICD-10-CM

## 2021-01-11 DIAGNOSIS — B078 Other viral warts: Secondary | ICD-10-CM | POA: Diagnosis not present

## 2021-01-11 DIAGNOSIS — E785 Hyperlipidemia, unspecified: Secondary | ICD-10-CM

## 2021-01-11 DIAGNOSIS — Z125 Encounter for screening for malignant neoplasm of prostate: Secondary | ICD-10-CM

## 2021-01-11 MED ORDER — IMIQUIMOD 5 % EX CREA: TOPICAL_CREAM | CUTANEOUS | 3 refills | Status: DC

## 2021-01-11 NOTE — Assessment & Plan Note (Signed)
Encouraged pt to continue healthy diet, exercise and weight loss. Colo up to date. Declines flu shot.  Encouraged pt to stop vaping.

## 2021-01-11 NOTE — Assessment & Plan Note (Signed)
Has not responded to cryotherapy.  Will give trial of topical aldara TIW.

## 2021-01-11 NOTE — Progress Notes (Addendum)
Subjective:     Patient ID: Jonathan Powell, male    DOB: 09/02/1965, 55 y.o.   MRN: [id number]  Chief Complaint  Patient presents with   Annual Exam         HPI Patient is in today for cpx.  He is requesting labs which I have placed.  Immunizations:  Tdap 2016, had shingrix x 2.  Reports 2 covid vaccines- does not have card with him.   Diet: working on nutrition Exercise: regular  Colonoscopy: 09/27/18 Vision: up to date Dental:  up to date Wt Readings from Last 3 Encounters:  01/11/21 184 lb (83.5 kg)  09/03/19 195 lb (88.5 kg)  11/27/18 185 lb (83.9 kg)     Health Maintenance Due  Topic Date Due   COVID-19 Vaccine (1) Never done   Pneumococcal Vaccine 60-65 Years old (1 - PCV) Never done   INFLUENZA VACCINE  12/06/2020    Past Medical History:  Diagnosis Date   Anxiety    GERD (gastroesophageal reflux disease)    History of chicken pox    History of kidney stones    Hyperlipidemia    Pneumonia     Past Surgical History:  Procedure Laterality Date   CHOLECYSTECTOMY     FOOT SURGERY Right 2001   toe surgery    Family History  Problem Relation Age of Onset   Hyperlipidemia Mother    Irritable bowel syndrome Mother    Heart attack Mother    Arthritis Maternal Grandmother    Kidney failure Paternal Grandfather    Alcohol abuse Paternal Grandfather        deceased   ADD / ADHD Son        adopted   Alcohol abuse Father    Kidney Stones Father    Colon cancer Neg Hx    Esophageal cancer Neg Hx    Rectal cancer Neg Hx    Stomach cancer Neg Hx     Social History   Socioeconomic History   Marital status: Married    Spouse name: Not on file   Number of children: 2   Years of education: Not on file   Highest education level: Not on file  Occupational History   Occupation: VP Scientist, clinical (histocompatibility and immunogenetics): [employer]    Comment: self employed  Tobacco Use   Smoking status: Former    Packs/day: 1.00    Types: Cigarettes   Smokeless tobacco:  Never  Substance and Sexual Activity   Alcohol use: Not Currently   Drug use: No   Sexual activity: Yes    Partners: Female  Other Topics Concern   Not on file  Social History Narrative   Occupation: vice Agricultural engineer, security company   Current Smoker-  2 PPD.   Alcohol use-yes,  very rare 1-2 drinks   Regular exercise-yes   Smoking Status:  current   Packs/Day:  1   Caffeine use/day:  3-4 daily   Does Patient Exercise:  Yes   Married- one biological son, one adopted son, step son   Social Determinants of Radio broadcast assistant Strain: Not on file  Food Insecurity: Not on file  Transportation Needs: Not on file  Physical Activity: Not on file  Stress: Not on file  Social Connections: Not on file  Intimate Partner Violence: Not on file    Outpatient Medications Prior to Visit  Medication Sig Dispense Refill   aspirin EC 81 MG tablet Take 1 tablet (  81 mg total) by mouth daily.     FLUoxetine (PROZAC) 20 MG capsule Take 1 capsule (20 mg total) by mouth daily. Please cancell previous prescription for Fluoxetine 20 mg Tablets (insurance will only cover capsules) 90 capsule 3   atorvastatin (LIPITOR) 20 MG tablet Take 1 tablet (20 mg total) by mouth daily. 90 tablet 1   pantoprazole (PROTONIX) 40 MG tablet Please schedule an Office Visit for further refills. Last seen 07-2017 60 tablet 0   No facility-administered medications prior to visit.    No Known Allergies  Review of Systems  Constitutional:  Negative for weight loss.  HENT:  Negative for congestion.   Eyes:  Negative for blurred vision.  Respiratory:  Negative for cough.   Cardiovascular:  Negative for chest pain.  Gastrointestinal:  Negative for constipation and diarrhea.  Genitourinary:  Negative for dysuria, frequency and hematuria.  Musculoskeletal:  Negative for joint pain and myalgias.  Skin:  Negative for rash.  Neurological:  Negative for headaches.  Psychiatric/Behavioral:         Denies  depression/anxiety      Objective:    Physical Exam  BP 128/77 (BP Location: Right Arm, Patient Position: Sitting, Cuff Size: Small)   Pulse 65   Temp 98.5 F (36.9 C) (Oral)   Resp 16   Wt 184 lb (83.5 kg)   SpO2 100%   BMI 25.66 kg/m  Wt Readings from Last 3 Encounters:  01/11/21 184 lb (83.5 kg)  09/03/19 195 lb (88.5 kg)  11/27/18 185 lb (83.9 kg)   Physical Exam  Constitutional: He is oriented to person, place, and time. He appears well-developed and well-nourished. No distress.  HENT:  Head: Normocephalic and atraumatic.  Right Ear: Tympanic membrane and ear canal normal.  Left Ear: Tympanic membrane and ear canal normal.  Mouth/Throat: Oropharynx is clear and moist.  Eyes: Pupils are equal, round, and reactive to light. No scleral icterus.  Neck: Normal range of motion. No thyromegaly present.  Cardiovascular: Normal rate and regular rhythm.   No murmur heard. Pulmonary/Chest: Effort normal and breath sounds normal. No respiratory distress. He has no wheezes. He has no rales. He exhibits no tenderness.  Abdominal: Soft. Bowel sounds are normal. He exhibits no distension and no mass. There is no tenderness. There is no rebound and no guarding.  Musculoskeletal: He exhibits no edema.  Lymphadenopathy:    He has no cervical adenopathy.  Neurological: He is alert and oriented to person, place, and time. He has normal patellar reflexes. He exhibits normal muscle tone. Coordination normal.  Skin: Skin is warm and dry. Sebaceous cyst right upper cheek. + wart right forearm Psychiatric: He has a normal mood and affect. His behavior is normal. Judgment and thought content normal.           Assessment & Plan:        Assessment & Plan:   Problem List Items Addressed This Visit       Unprioritized   Sebaceous cyst   Relevant Orders   Ambulatory referral to Plastic Surgery   Preventative health care - Primary    Encouraged pt to continue healthy diet, exercise  and weight loss. Colo up to date. Declines flu shot.  Encouraged pt to stop vaping.       Relevant Orders   CBC with Differential/Platelet   Homocysteine   TSH   Other viral warts    Has not responded to cryotherapy.  Will give trial of topical aldara TIW.  Relevant Medications   imiquimod (ALDARA) 5 % cream (Start on 01/12/2021)   Hyperlipidemia   Relevant Orders   Homocysteine   Lipid panel   Other Visit Diagnoses     Hyperglycemia       Relevant Orders   Hemoglobin A1c   Comp Met (CMET)   Prostate cancer screening       Relevant Orders   PSA       I have discontinued Pablo Ledger. Hamill "Michael"'s pantoprazole and atorvastatin. I am also having him start on imiquimod. Additionally, I am having him maintain his aspirin EC and FLUoxetine.  Meds ordered this encounter  Medications   imiquimod (ALDARA) 5 % cream    Sig: Apply topically 3 (three) times a week.    Dispense:  12 each    Refill:  3    Order Specific Question:   Supervising Provider    Answer:   Penni Homans A [id number]

## 2021-01-12 LAB — COMPREHENSIVE METABOLIC PANEL
ALT: 27 U/L (ref 0–53)
AST: 33 U/L (ref 0–37)
Albumin: 4.3 g/dL (ref 3.5–5.2)
Alkaline Phosphatase: 80 U/L (ref 39–117)
BUN: 21 mg/dL (ref 6–23)
CO2: 25 mEq/L (ref 19–32)
Calcium: 9.7 mg/dL (ref 8.4–10.5)
Chloride: 104 mEq/L (ref 96–112)
Creatinine, Ser: 1.03 mg/dL (ref 0.40–1.50)
GFR: 83 mL/min (ref >60.00)
Glucose, Bld: 90 mg/dL (ref 70–99)
Potassium: 4.4 mEq/L (ref 3.5–5.1)
Sodium: 140 mEq/L (ref 135–145)
Total Bilirubin: 0.4 mg/dL (ref 0.2–1.2)
Total Protein: 7 g/dL (ref 6.0–8.3)

## 2021-01-12 LAB — HEMOGLOBIN A1C: Hgb A1c MFr Bld: 6 % (ref 4.6–6.5)

## 2021-01-12 LAB — LIPID PANEL
Cholesterol: 274 mg/dL — ABNORMAL HIGH (ref 0–200)
HDL: 60.3 mg/dL (ref >39.00)
LDL Cholesterol: 196 mg/dL — ABNORMAL HIGH (ref 0–99)
NonHDL: 213.58
Total CHOL/HDL Ratio: 5
Triglycerides: 90 mg/dL (ref 0.0–149.0)
VLDL: 18 mg/dL (ref 0.0–40.0)

## 2021-01-12 LAB — PSA: PSA: 1.33 ng/mL (ref 0.10–4.00)

## 2021-01-12 LAB — TSH: TSH: 0.8 u[IU]/mL (ref 0.35–5.50)

## 2021-01-12 LAB — HOMOCYSTEINE: Homocysteine: 9.4 umol/L (ref <11.4)

## 2021-01-12 NOTE — Addendum Note (Signed)
Addended by: Kelle Darting A on: 01/12/2021 02:36 PM   Modules accepted: Orders

## 2021-01-13 ENCOUNTER — Other Ambulatory Visit (INDEPENDENT_AMBULATORY_CARE_PROVIDER_SITE_OTHER): Payer: BC Managed Care – PPO

## 2021-01-13 ENCOUNTER — Other Ambulatory Visit: Payer: Self-pay

## 2021-01-13 ENCOUNTER — Telehealth: Payer: Self-pay | Admitting: Family

## 2021-01-13 DIAGNOSIS — Z0000 Encounter for general adult medical examination without abnormal findings: Secondary | ICD-10-CM

## 2021-01-13 DIAGNOSIS — H16291 Other keratoconjunctivitis, right eye: Secondary | ICD-10-CM | POA: Diagnosis not present

## 2021-01-13 LAB — CBC WITH DIFFERENTIAL/PLATELET
Basophils Absolute: 0.1 10*3/uL (ref 0.0–0.1)
Basophils Relative: 0.9 % (ref 0.0–3.0)
Eosinophils Absolute: 0.1 10*3/uL (ref 0.0–0.7)
Eosinophils Relative: 1.9 % (ref 0.0–5.0)
HCT: 42.1 % (ref 39.0–52.0)
Hemoglobin: 13.7 g/dL (ref 13.0–17.0)
Lymphocytes Relative: 38.6 % (ref 12.0–46.0)
Lymphs Abs: 2.1 10*3/uL (ref 0.7–4.0)
MCHC: 32.6 g/dL (ref 30.0–36.0)
MCV: 92.4 fl (ref 78.0–100.0)
Monocytes Absolute: 0.5 10*3/uL (ref 0.1–1.0)
Monocytes Relative: 8.3 % (ref 3.0–12.0)
Neutro Abs: 2.8 10*3/uL (ref 1.4–7.7)
Neutrophils Relative %: 50.3 % (ref 43.0–77.0)
Platelets: 292 10*3/uL (ref 150.0–400.0)
RBC: 4.56 Mil/uL (ref 4.22–5.81)
RDW: 13.5 % (ref 11.5–15.5)
WBC: 4.7 10*3/uL (ref 4.0–10.5)

## 2021-01-13 NOTE — Progress Notes (Signed)
No charge patient repeating labs

## 2021-01-13 NOTE — Telephone Encounter (Signed)
See mychart.  

## 2021-02-16 DIAGNOSIS — H168 Other keratitis: Secondary | ICD-10-CM | POA: Diagnosis not present

## 2021-02-21 DIAGNOSIS — H168 Other keratitis: Secondary | ICD-10-CM | POA: Diagnosis not present

## 2021-03-21 ENCOUNTER — Encounter: Payer: Self-pay | Admitting: Plastic Surgery

## 2021-03-21 ENCOUNTER — Ambulatory Visit (INDEPENDENT_AMBULATORY_CARE_PROVIDER_SITE_OTHER): Payer: BC Managed Care – PPO | Admitting: Plastic Surgery

## 2021-03-21 ENCOUNTER — Other Ambulatory Visit: Payer: Self-pay

## 2021-03-21 VITALS — BP 127/76 | HR 67 | Ht 71.0 in | Wt 193.4 lb

## 2021-03-21 DIAGNOSIS — D489 Neoplasm of uncertain behavior, unspecified: Secondary | ICD-10-CM

## 2021-03-21 NOTE — Progress Notes (Addendum)
Referring Provider Debbrah Alar, NP [address] [place],  [place] [address]   CC:  Right face cystic lesion  Jonathan Powell is an 55 y.o. male.  HPI: The patient is a 55 year old male with 2-year history of right face cystic lesion.  He notes that its grown a little bit larger.  He is interested in surgical treatment of this.  Of note the patient is a tobacco smoker.  No Known Allergies  Outpatient Encounter Medications as of 03/21/2021  Medication Sig   aspirin EC 81 MG tablet Take 1 tablet (81 mg total) by mouth daily.   FLUoxetine (PROZAC) 20 MG capsule Take 1 capsule (20 mg total) by mouth daily. Please cancell previous prescription for Fluoxetine 20 mg Tablets (insurance will only cover capsules)   imiquimod (ALDARA) 5 % cream Apply topically 3 (three) times a week.   [DISCONTINUED] omeprazole (PRILOSEC) 20 MG capsule Take 1 capsule (20 mg total) by mouth daily.   No facility-administered encounter medications on file as of 03/21/2021.     Past Medical History:  Diagnosis Date   Anxiety    GERD (gastroesophageal reflux disease)    History of chicken pox    History of kidney stones    Hyperlipidemia    Pneumonia     Past Surgical History:  Procedure Laterality Date   CHOLECYSTECTOMY     FOOT SURGERY Right 2001   toe surgery    Family History  Problem Relation Age of Onset   Hyperlipidemia Mother    Irritable bowel syndrome Mother    Heart attack Mother    Arthritis Maternal Grandmother    Kidney failure Paternal Grandfather    Alcohol abuse Paternal Grandfather        deceased   ADD / ADHD Son        adopted   Alcohol abuse Father    Kidney Stones Father    Colon cancer Neg Hx    Esophageal cancer Neg Hx    Rectal cancer Neg Hx    Stomach cancer Neg Hx     Social History   Social History Narrative   Occupation: vice Agricultural engineer, security company   Current Smoker-  2 PPD.   Alcohol use-yes,  very rare 1-2 drinks    Regular exercise-yes   Smoking Status:  current   Packs/Day:  1   Caffeine use/day:  3-4 daily   Does Patient Exercise:  Yes   Married- one biological son, one adopted son, step son     Review of Systems General: Denies fevers, chills, weight loss CV: Denies chest pain, shortness of breath, palpitations   Physical Exam Vitals with BMI 03/21/2021 01/11/2021 09/03/2019  Height 5\' 11"  - 5\' 11"   Weight 193 lbs 6 oz 184 lbs 195 lbs  BMI 37.04 - 88.89  Systolic 169 450 388  Diastolic 76 77 65  Pulse 67 65 81    General:  No acute distress,  Alert and oriented, Non-Toxic, Normal speech and affect HEENT: Right face cystic lesion, 1 cm, punctum visible, no erythema.  Assessment/Plan Likely of right face sebaceous cyst.  Is bothersome to the patient and excision is indicated.  We discussed excision in office.  Patient is aware that smoking is an additional risk but we discussed that for small lesion excision we could proceed.  Time based coding: 16 minutes were spent with the patient.  Greater than 50% was spent on counseling cordination of care.  We discussed the  nature of surgical excision and the expected minimal scarring.  Jonathan Powell 03/21/2021, 1:21 PM

## 2021-03-29 ENCOUNTER — Other Ambulatory Visit (HOSPITAL_COMMUNITY)
Admission: RE | Admit: 2021-03-29 | Discharge: 2021-03-29 | Disposition: A | Discharge status: Home / Self Care | Payer: BC Managed Care – PPO | Source: Ambulatory Visit | Attending: Plastic Surgery | Admitting: Plastic Surgery

## 2021-03-29 ENCOUNTER — Ambulatory Visit (INDEPENDENT_AMBULATORY_CARE_PROVIDER_SITE_OTHER): Payer: BC Managed Care – PPO | Admitting: Plastic Surgery

## 2021-03-29 ENCOUNTER — Other Ambulatory Visit: Payer: Self-pay

## 2021-03-29 ENCOUNTER — Ambulatory Visit: Payer: BC Managed Care – PPO | Admitting: Plastic Surgery

## 2021-03-29 VITALS — BP 123/72 | HR 59

## 2021-03-29 DIAGNOSIS — L720 Epidermal cyst: Secondary | ICD-10-CM | POA: Diagnosis not present

## 2021-03-29 DIAGNOSIS — D489 Neoplasm of uncertain behavior, unspecified: Secondary | ICD-10-CM | POA: Insufficient documentation

## 2021-03-29 NOTE — Progress Notes (Signed)
Operative Note   DATE OF OPERATION: 03/29/2021  LOCATION:    SURGICAL DEPARTMENT: Plastic Surgery  PREOPERATIVE DIAGNOSES:  right cheek cystic lesion  POSTOPERATIVE DIAGNOSES:  same  PROCEDURE:  Excision of right face cystic lesion measuring 1.2 cm 1.5 cm intermediate closure right face  SURGEON: Melene Plan. Zeev Deakins, MD  ANESTHESIA:  Local  COMPLICATIONS: None.   INDICATIONS FOR PROCEDURE:  The patient, Jonathan Powell is a 55 y.o. male born on [birth year], is here for treatment of right cheek cystic lesion. MRN: [id number]  CONSENT:  Informed consent was obtained directly from the patient. Risks, benefits and alternatives were fully discussed. Specific risks including but not limited to bleeding, infection, hematoma, seroma, scarring, pain, infection, wound healing problems, and need for further surgery were all discussed. The patient did have an ample opportunity to have questions answered to satisfaction.   DESCRIPTION OF PROCEDURE:  Local anesthesia was administered. The patient's operative site was prepped and draped in a sterile fashion. A time out was performed and all information was confirmed to be correct.  The lesion was excised with a 15 blade.  Hemostasis was obtained.  Circumferential undermining was performed and the skin was advanced and closed in layers with interrupted buried Monocryl sutures and prolene for the skin.  The lesion excised measured 1.2 cm, and the total length of closure measured 1.5 cm.  Specimen was sent to pathology.  The patient tolerated the procedure well.  There were no complications.

## 2021-04-01 LAB — SURGICAL PATHOLOGY

## 2021-04-04 ENCOUNTER — Encounter: Payer: Self-pay | Admitting: Plastic Surgery

## 2021-04-04 ENCOUNTER — Ambulatory Visit (INDEPENDENT_AMBULATORY_CARE_PROVIDER_SITE_OTHER): Payer: BC Managed Care – PPO | Admitting: Plastic Surgery

## 2021-04-04 ENCOUNTER — Other Ambulatory Visit: Payer: Self-pay

## 2021-04-04 DIAGNOSIS — L723 Sebaceous cyst: Secondary | ICD-10-CM

## 2021-04-04 NOTE — Progress Notes (Signed)
Status post excision of right cheek cystic lesion on 03/29/2021.  Doing well without complaints.  Physical exam Incision clean dry intact, no erythema.  Pathology: Epidermal inclusion cyst  Assessment and plan Patient doing well after excision of right cheek epidermal inclusion cyst.  Sutures removed we will see the patient back as needed if we need to discuss scarring.

## 2021-04-22 ENCOUNTER — Ambulatory Visit: Payer: BC Managed Care – PPO | Admitting: Plastic Surgery

## 2021-04-25 ENCOUNTER — Ambulatory Visit: Payer: BC Managed Care – PPO | Admitting: Plastic Surgery

## 2021-05-06 ENCOUNTER — Ambulatory Visit: Payer: BC Managed Care – PPO | Admitting: Plastic Surgery

## 2021-05-19 ENCOUNTER — Other Ambulatory Visit: Payer: Self-pay | Admitting: Family

## 2021-05-19 MED ORDER — FLUOXETINE HCL 20 MG PO CAPS: 20.0000 mg | ORAL_CAPSULE | Freq: Every day | ORAL | 1 refills | Status: DC

## 2021-05-26 ENCOUNTER — Telehealth: Payer: Self-pay

## 2021-05-26 NOTE — Telephone Encounter (Signed)
PA SUBMITTED FOR IMIQUIMOD 5 %

## 2021-05-27 ENCOUNTER — Telehealth: Payer: Self-pay

## 2021-05-27 NOTE — Telephone Encounter (Signed)
Pa initiated for imiquimod 5% cream

## 2021-05-30 NOTE — Telephone Encounter (Signed)
Henri Medal (Key: [id number]) Imiquimod 5% cream     Status: PA Response - Approved  Created: January 18th, 2023 [id number]  Sent: January 19th, 2023  Open   Archived - Outcome: Approved

## 2021-06-03 ENCOUNTER — Other Ambulatory Visit: Payer: Self-pay

## 2021-06-03 ENCOUNTER — Ambulatory Visit
Admission: EM | Admit: 2021-06-03 | Discharge: 2021-06-03 | Disposition: A | Discharge status: Home / Self Care | Payer: BC Managed Care – PPO | Attending: Emergency Medicine | Admitting: Emergency Medicine

## 2021-06-03 DIAGNOSIS — E291 Testicular hypofunction: Secondary | ICD-10-CM | POA: Diagnosis not present

## 2021-06-03 DIAGNOSIS — R5383 Other fatigue: Secondary | ICD-10-CM | POA: Diagnosis not present

## 2021-06-03 DIAGNOSIS — R531 Weakness: Secondary | ICD-10-CM | POA: Diagnosis not present

## 2021-06-03 NOTE — ED Provider Notes (Signed)
[facility]    CSN: [id number] Arrival date & time: 06/03/21  1310    HISTORY   Chief Complaint  Patient presents with   Fatigue   Dizziness   Tachycardia   HPI Jonathan Powell is a 56 y.o. male. Pt reports beginning Monday he began feeling fatigue, lightheaded and having intermittent episodes of tachycardia without activity.  Patient states he has a medical history of hyperlipidemia, recent intentional weight loss of 20 pounds.  EMR reviewed, patient does have routine follow-up with a primary care provider.  Last TSH level was normal in September of last year, last testosterone level was low, this has not been checked since April 2021, no testosterone replacement was prescribed.  Patient also has a history of "viral warts" for which he has been previously prescribed Aldara cream.  Patient states he would like to get "checked out" to see what might be wrong with him.  EKG today was normal.   Past Medical History:  Diagnosis Date   Anxiety    GERD (gastroesophageal reflux disease)    History of chicken pox    History of kidney stones    Hyperlipidemia    Pneumonia    Patient Active Problem List   Diagnosis Date Noted   Other viral warts 01/11/2021   Sebaceous cyst 01/11/2021   Hyperlipidemia 05/12/2016   Fatty liver 07/05/2015   Preventative health care 12/07/2014   GERD (gastroesophageal reflux disease) 10/27/2014   Elevated LFTs 04/26/2011   Skin lesion of face 04/26/2011   ANXIETY DEPRESSION 11/24/2009   Vaping nicotine dependence, non-tobacco product 11/24/2009   Past Surgical History:  Procedure Laterality Date   CHOLECYSTECTOMY     FOOT SURGERY Right 2001   toe surgery    Home Medications    Prior to Admission medications   Medication Sig Start Date End Date Taking? Authorizing Provider  aspirin EC 81 MG tablet Take 1 tablet (81 mg total) by mouth daily. 05/17/16   Debbrah Alar, NP  FLUoxetine (PROZAC) 20 MG capsule Take 1 capsule (20  mg total) by mouth daily. 05/19/21   Debbrah Alar, NP  omeprazole (PRILOSEC) 20 MG capsule Take 1 capsule (20 mg total) by mouth daily. 08/29/14 08/29/14  Palumbo, April, MD    Family History Family History  Problem Relation Age of Onset   Hyperlipidemia Mother    Irritable bowel syndrome Mother    Heart attack Mother    Arthritis Maternal Grandmother    Kidney failure Paternal Grandfather    Alcohol abuse Paternal Grandfather        deceased   ADD / ADHD Son        adopted   Alcohol abuse Father    Kidney Stones Father    Colon cancer Neg Hx    Esophageal cancer Neg Hx    Rectal cancer Neg Hx    Stomach cancer Neg Hx    Social History Social History   Tobacco Use   Smoking status: Former    Packs/day: 1.00    Types: Cigarettes   Smokeless tobacco: Never  Substance Use Topics   Alcohol use: Not Currently   Drug use: No   Allergies   Patient has no known allergies.  Review of Systems Review of Systems Pertinent findings noted in history of present illness.   Physical Exam Triage Vital Signs ED Triage Vitals  Enc Vitals Group     BP 03/04/21 0827 (!) 147/82     Pulse Rate 03/04/21 0827 72  Resp 03/04/21 0827 18     Temp 03/04/21 0827 98.3 F (36.8 C)     Temp Source 03/04/21 0827 Oral     SpO2 03/04/21 0827 98 %     Weight --      Height --      Head Circumference --      Peak Flow --      Pain Score 03/04/21 0826 5     Pain Loc --      Pain Edu? --      Excl. in [place]? --   No data found.  Updated Vital Signs BP 135/78 (BP Location: Right Arm)    Pulse 78    Temp 99.1 F (37.3 C) (Oral)    Resp 18    SpO2 96%   Physical Exam Vitals and nursing note reviewed.  Constitutional:      General: He is not in acute distress.    Appearance: Normal appearance. He is not ill-appearing.  HENT:     Head: Normocephalic and atraumatic.  Eyes:     General: Lids are normal.        Right eye: No discharge.        Left eye: No discharge.     Extraocular  Movements: Extraocular movements intact.     Conjunctiva/sclera: Conjunctivae normal.     Right eye: Right conjunctiva is not injected.     Left eye: Left conjunctiva is not injected.  Neck:     Trachea: Trachea and phonation normal.  Cardiovascular:     Rate and Rhythm: Normal rate and regular rhythm.     Pulses: Normal pulses.     Heart sounds: Normal heart sounds. No murmur heard.   No friction rub. No gallop.  Pulmonary:     Effort: Pulmonary effort is normal. No accessory muscle usage, prolonged expiration or respiratory distress.     Breath sounds: Normal breath sounds. No stridor, decreased air movement or transmitted upper airway sounds. No decreased breath sounds, wheezing, rhonchi or rales.  Chest:     Chest wall: No tenderness.  Musculoskeletal:        General: Normal range of motion.     Cervical back: Normal range of motion and neck supple. Normal range of motion.  Lymphadenopathy:     Cervical: No cervical adenopathy.  Skin:    General: Skin is warm and dry.     Findings: No erythema or rash.  Neurological:     General: No focal deficit present.     Mental Status: He is alert and oriented to person, place, and time.  Psychiatric:        Mood and Affect: Mood normal.        Behavior: Behavior normal.    Visual Acuity Right Eye Distance:   Left Eye Distance:   Bilateral Distance:    Right Eye Near:   Left Eye Near:    Bilateral Near:     UC Couse / Diagnostics / Procedures:    EKG  Radiology No results found.  Procedures Procedures (including critical care time)  UC Diagnoses / Final Clinical Impressions(s)   I have reviewed the triage vital signs and the nursing notes.  Pertinent labs & imaging results that were available during my care of the patient were reviewed by me and considered in my medical decision making (see chart for details).    Final diagnoses:  Other fatigue  Hypogonadism in male  Weakness   Patient is well-appearing on exam  today.  Vital signs are normal on arrival.  Patient was screened for COVID/influenza, blood work was performed to evaluate for possible signs of infection, hormonal imbalance, anemia, organ dysfunction.  We will notify patient of results once received, will forward results to his primary care provider for follow-up.  ED Prescriptions   None    PDMP not reviewed this encounter.  Pending results:  Labs Reviewed  COVID-19, FLU A+B NAA  TESTOSTERONE,FREE AND TOTAL  CBC WITH DIFFERENTIAL/PLATELET  COMPREHENSIVE METABOLIC PANEL  HEMOGLOBIN A1C  TSH  T4, FREE  T3, FREE  PSA  PSA, TOTAL AND FREE  HIV ANTIBODY (ROUTINE TESTING W REFLEX)    Medications Ordered in UC: Medications - No data to display  Disposition Upon Discharge:  Condition: stable for discharge home Home: take medications as prescribed; routine discharge instructions as discussed; follow up as advised.  Patient presented with an acute illness with associated systemic symptoms and significant discomfort requiring urgent management. In my opinion, this is a condition that a prudent lay person (someone who possesses an average knowledge of health and medicine) may potentially expect to result in complications if not addressed urgently such as respiratory distress, impairment of bodily function or dysfunction of bodily organs.   Routine symptom specific, illness specific and/or disease specific instructions were discussed with the patient and/or caregiver at length.   As such, the patient has been evaluated and assessed, work-up was performed and treatment was provided in alignment with urgent care protocols and evidence based medicine.  Patient/parent/caregiver has been advised that the patient may require follow up for further testing and treatment if the symptoms continue in spite of treatment, as clinically indicated and appropriate.  If the patient was tested for COVID-19, Influenza and/or RSV, then the  patient/parent/guardian was advised to isolate at home pending the results of his/her diagnostic coronavirus test and potentially longer if theyre positive. I have also advised pt that if his/her COVID-19 test returns positive, it's recommended to self-isolate for at least 10 days after symptoms first appeared AND until fever-free for 24 hours without fever reducer AND other symptoms have improved or resolved. Discussed self-isolation recommendations as well as instructions for household member/close contacts as per the [facility] and [place] [facility], and also gave patient the [facility] packet with this information.  Patient/parent/caregiver has been advised to return to the [facility] or PCP in 3-5 days if no better; to PCP or the Emergency Department if new signs and symptoms develop, or if the current signs or symptoms continue to change or worsen for further workup, evaluation and treatment as clinically indicated and appropriate  The patient will follow up with their current PCP if and as advised. If the patient does not currently have a PCP we will assist them in obtaining one.   The patient may need specialty follow up if the symptoms continue, in spite of conservative treatment and management, for further workup, evaluation, consultation and treatment as clinically indicated and appropriate.   Patient/parent/caregiver verbalized understanding and agreement of plan as discussed.  All questions were addressed during visit.  Please see discharge instructions below for further details of plan.  Discharge Instructions:   Discharge Instructions      We will notify you of the results of your viral testing and blood work once it is complete, I anticipate that the results will be posted to your MyChart in the next 3 to 5 days.  If there are any acutely abnormal findings, you will be contacted by phone with  further recommendations, if any.  The results of your blood work will also be forwarded to your primary care provider  with whom I recommend that you follow-up for this issue.  Thank you for visiting urgent care today.    This office note has been dictated using Museum/gallery curator.  Unfortunately, and despite my best efforts, this method of dictation can sometimes lead to occasional typographical or grammatical errors.  I apologize in advance if this occurs.     Lynden Oxford Scales, PA-C 06/03/21 [phone]

## 2021-06-03 NOTE — ED Triage Notes (Signed)
Pt reports beginning Monday he began feeling fatigue, lightheaded and having tachycardia.

## 2021-06-03 NOTE — Discharge Instructions (Addendum)
We will notify you of the results of your viral testing and blood work once it is complete, I anticipate that the results will be posted to your MyChart in the next 3 to 5 days.  If there are any acutely abnormal findings, you will be contacted by phone with further recommendations, if any.  The results of your blood work will also be forwarded to your primary care provider with whom I recommend that you follow-up for this issue.  Thank you for visiting urgent care today.

## 2021-06-05 LAB — COVID-19, FLU A+B NAA
Influenza A, NAA: NOT DETECTED
Influenza B, NAA: NOT DETECTED
SARS-CoV-2, NAA: NOT DETECTED

## 2021-06-11 LAB — CBC WITH DIFFERENTIAL/PLATELET
Basophils Absolute: 0.1 10*3/uL (ref 0.0–0.2)
Basos: 1 %
EOS (ABSOLUTE): 0.1 10*3/uL (ref 0.0–0.4)
Eos: 1 %
Hematocrit: 43 % (ref 37.5–51.0)
Hemoglobin: 14.4 g/dL (ref 13.0–17.7)
Immature Grans (Abs): 0 10*3/uL (ref 0.0–0.1)
Immature Granulocytes: 1 %
Lymphocytes Absolute: 1.5 10*3/uL (ref 0.7–3.1)
Lymphs: 24 %
MCH: 29.8 pg (ref 26.6–33.0)
MCHC: 33.5 g/dL (ref 31.5–35.7)
MCV: 89 fL (ref 79–97)
Monocytes Absolute: 0.5 10*3/uL (ref 0.1–0.9)
Monocytes: 7 %
Neutrophils Absolute: 4.4 10*3/uL (ref 1.4–7.0)
Neutrophils: 66 %
Platelets: 162 10*3/uL (ref 150–450)
RBC: 4.83 x10E6/uL (ref 4.14–5.80)
RDW: 12.1 % (ref 11.6–15.4)
WBC: 6.6 10*3/uL (ref 3.4–10.8)

## 2021-06-11 LAB — COMPREHENSIVE METABOLIC PANEL
ALT: 29 IU/L (ref 0–44)
AST: 14 IU/L (ref 0–40)
Albumin/Globulin Ratio: 1.8 (ref 1.2–2.2)
Albumin: 4.4 g/dL (ref 3.8–4.9)
Alkaline Phosphatase: 94 IU/L (ref 44–121)
BUN/Creatinine Ratio: 26 — ABNORMAL HIGH (ref 9–20)
BUN: 23 mg/dL (ref 6–24)
Bilirubin Total: <0.2 mg/dL (ref 0.0–1.2)
CO2: 22 mmol/L (ref 20–29)
Calcium: 9.5 mg/dL (ref 8.7–10.2)
Chloride: 107 mmol/L — ABNORMAL HIGH (ref 96–106)
Creatinine, Ser: 0.87 mg/dL (ref 0.76–1.27)
Globulin, Total: 2.5 g/dL (ref 1.5–4.5)
Glucose: 117 mg/dL — ABNORMAL HIGH (ref 70–99)
Potassium: 4.4 mmol/L (ref 3.5–5.2)
Sodium: 143 mmol/L (ref 134–144)
Total Protein: 6.9 g/dL (ref 6.0–8.5)
eGFR: 103 mL/min/{1.73_m2} (ref >59)

## 2021-06-11 LAB — TSH: TSH: 0.717 u[IU]/mL (ref 0.450–4.500)

## 2021-06-11 LAB — PSA, TOTAL AND FREE
PSA, Free Pct: 15 %
PSA, Free: 0.24 ng/mL
Prostate Specific Ag, Serum: 1.6 ng/mL (ref 0.0–4.0)

## 2021-06-11 LAB — TESTOSTERONE,FREE AND TOTAL
Testosterone, Free: 3.2 pg/mL — ABNORMAL LOW (ref 7.2–24.0)
Testosterone: 285 ng/dL (ref 264–916)

## 2021-06-11 LAB — HIV ANTIBODY (ROUTINE TESTING W REFLEX): HIV Screen 4th Generation wRfx: NONREACTIVE

## 2021-06-11 LAB — HEMOGLOBIN A1C
Est. average glucose Bld gHb Est-mCnc: 123 mg/dL
Hgb A1c MFr Bld: 5.9 % — ABNORMAL HIGH (ref 4.8–5.6)

## 2021-06-11 LAB — T3, FREE: T3, Free: 2.9 pg/mL (ref 2.0–4.4)

## 2021-06-11 LAB — T4, FREE: Free T4: 1.18 ng/dL (ref 0.82–1.77)

## 2021-06-21 ENCOUNTER — Telehealth (INDEPENDENT_AMBULATORY_CARE_PROVIDER_SITE_OTHER): Payer: BC Managed Care – PPO | Admitting: Family

## 2021-06-21 DIAGNOSIS — R7989 Other specified abnormal findings of blood chemistry: Secondary | ICD-10-CM

## 2021-06-21 NOTE — Progress Notes (Signed)
MyChart Video Visit    Virtual Visit via Video Note   This visit type was conducted due to national recommendations for restrictions regarding the COVID-19 Pandemic (e.g. social distancing) in an effort to limit this patient's exposure and mitigate transmission in our community. This patient is at least at moderate risk for complications without adequate follow up. This format is felt to be most appropriate for this patient at this time. Physical exam was limited by quality of the video and audio technology used for the visit. CMA was able to get the patient set up on a video visit.  Patient location: Home. Patient and provider in visit Provider location: Office  I discussed the limitations of evaluation and management by telemedicine and the availability of in person appointments. The patient expressed understanding and agreed to proceed.  Visit Date: 06/21/2021  Today's healthcare provider: [name], NP     Subjective:    Patient ID: Jonathan Powell, male    DOB: [birth year], 56 y.o.   MRN: [id number]  Chief Complaint  Patient presents with   Follow-up    To go over recent labs    HPI  Patient recently went to urgent care with c/o fatigue.  Lab work revealed mild hyperglycemia which was not new and low testosterone. He notes that he is involved in a new business and has not been eating as well as he should,  nor has he been exercising as much as he should.     Past Medical History:  Diagnosis Date   Anxiety    GERD (gastroesophageal reflux disease)    History of chicken pox    History of kidney stones    Hyperlipidemia    Pneumonia     Past Surgical History:  Procedure Laterality Date   CHOLECYSTECTOMY     FOOT SURGERY Right 2001   toe surgery    Family History  Problem Relation Age of Onset   Hyperlipidemia Mother    Irritable bowel syndrome Mother    Heart attack Mother    Arthritis Maternal Grandmother    Kidney failure Paternal  Grandfather    Alcohol abuse Paternal Grandfather        deceased   ADD / ADHD Son        adopted   Alcohol abuse Father    Kidney Stones Father    Colon cancer Neg Hx    Esophageal cancer Neg Hx    Rectal cancer Neg Hx    Stomach cancer Neg Hx     Social History   Socioeconomic History   Marital status: Married    Spouse name: Not on file   Number of children: 2   Years of education: Not on file   Highest education level: Not on file  Occupational History   Occupation: VP Scientist, clinical (histocompatibility and immunogenetics): [employer]    Comment: self employed  Tobacco Use   Smoking status: Former    Packs/day: 1.00    Types: Cigarettes   Smokeless tobacco: Never  Substance and Sexual Activity   Alcohol use: Not Currently   Drug use: No   Sexual activity: Yes    Partners: Female  Other Topics Concern   Not on file  Social History Narrative   Occupation: vice Agricultural engineer, security company   Current Smoker-  2 PPD.   Alcohol use-yes,  very rare 1-2 drinks   Regular exercise-yes   Smoking Status:  current   Packs/Day:  1  Caffeine use/day:  3-4 daily   Does Patient Exercise:  Yes   Married- one biological son, one adopted son, step son   Social Determinants of Radio broadcast assistant Strain: Not on file  Food Insecurity: Not on file  Transportation Needs: Not on file  Physical Activity: Not on file  Stress: Not on file  Social Connections: Not on file  Intimate Partner Violence: Not on file    Outpatient Medications Prior to Visit  Medication Sig Dispense Refill   aspirin EC 81 MG tablet Take 1 tablet (81 mg total) by mouth daily.     FLUoxetine (PROZAC) 20 MG capsule Take 1 capsule (20 mg total) by mouth daily. 90 capsule 1   No facility-administered medications prior to visit.    No Known Allergies  ROS     Objective:    Physical Exam Constitutional:      Appearance: Normal appearance.  HENT:     Head: Normocephalic and atraumatic.  Pulmonary:     Effort:  Pulmonary effort is normal.  Neurological:     Mental Status: He is alert and oriented to person, place, and time.  Psychiatric:        Mood and Affect: Mood normal.        Behavior: Behavior normal.        Thought Content: Thought content normal.    There were no vitals taken for this visit. 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 This is likely contributing to his overall fatigue. Reviewed labs from urgent care CBC, TSH were stable. . Will refer to Urology for evaluation for testosterone replacement therapy.        Relevant Orders   Ambulatory referral to Urology    I am having Jonathan Powell. Jonathan Powell" maintain his aspirin EC and FLUoxetine.  No orders of the defined types were placed in this encounter.   I discussed the assessment and treatment plan with the patient. The patient was provided an opportunity to ask questions and all were answered. The patient agreed with the plan and demonstrated an understanding of the instructions.   The patient was advised to call back or seek an in-person evaluation if the symptoms worsen or if the condition fails to improve as anticipated. Jonathan Pear, NP [employer] [facility] [phone] (phone) [phone] (fax)  [place]

## 2021-06-21 NOTE — Assessment & Plan Note (Addendum)
New. This is likely contributing to his overall fatigue. Reviewed labs from urgent care CBC, TSH were stable. . Will refer to Urology for evaluation for testosterone replacement therapy.

## 2021-07-04 ENCOUNTER — Encounter: Payer: Self-pay | Admitting: Family

## 2021-10-07 ENCOUNTER — Ambulatory Visit: Payer: BC Managed Care – PPO | Admitting: Plastic Surgery

## 2021-10-14 ENCOUNTER — Encounter: Payer: Self-pay | Admitting: Gastroenterology

## 2022-01-04 DIAGNOSIS — M9903 Segmental and somatic dysfunction of lumbar region: Secondary | ICD-10-CM | POA: Diagnosis not present

## 2022-01-04 DIAGNOSIS — M531 Cervicobrachial syndrome: Secondary | ICD-10-CM | POA: Diagnosis not present

## 2022-01-04 DIAGNOSIS — M9902 Segmental and somatic dysfunction of thoracic region: Secondary | ICD-10-CM | POA: Diagnosis not present

## 2022-01-04 DIAGNOSIS — M9901 Segmental and somatic dysfunction of cervical region: Secondary | ICD-10-CM | POA: Diagnosis not present

## 2022-01-10 ENCOUNTER — Telehealth: Payer: Self-pay | Admitting: Family

## 2022-01-10 NOTE — Telephone Encounter (Signed)
Please contact pt to schedule cpx.  

## 2022-01-11 DIAGNOSIS — M9903 Segmental and somatic dysfunction of lumbar region: Secondary | ICD-10-CM | POA: Diagnosis not present

## 2022-01-11 DIAGNOSIS — M9902 Segmental and somatic dysfunction of thoracic region: Secondary | ICD-10-CM | POA: Diagnosis not present

## 2022-01-11 DIAGNOSIS — M531 Cervicobrachial syndrome: Secondary | ICD-10-CM | POA: Diagnosis not present

## 2022-01-11 DIAGNOSIS — M9901 Segmental and somatic dysfunction of cervical region: Secondary | ICD-10-CM | POA: Diagnosis not present

## 2022-01-11 NOTE — Telephone Encounter (Signed)
Lvm to schedule

## 2022-01-18 DIAGNOSIS — M9902 Segmental and somatic dysfunction of thoracic region: Secondary | ICD-10-CM | POA: Diagnosis not present

## 2022-01-18 DIAGNOSIS — M531 Cervicobrachial syndrome: Secondary | ICD-10-CM | POA: Diagnosis not present

## 2022-01-18 DIAGNOSIS — M9903 Segmental and somatic dysfunction of lumbar region: Secondary | ICD-10-CM | POA: Diagnosis not present

## 2022-01-18 DIAGNOSIS — M9901 Segmental and somatic dysfunction of cervical region: Secondary | ICD-10-CM | POA: Diagnosis not present

## 2022-03-23 ENCOUNTER — Other Ambulatory Visit: Payer: Self-pay | Admitting: Family

## 2022-03-24 ENCOUNTER — Telehealth: Payer: Self-pay

## 2022-03-24 ENCOUNTER — Encounter: Payer: Self-pay | Admitting: Family

## 2022-03-24 ENCOUNTER — Ambulatory Visit (INDEPENDENT_AMBULATORY_CARE_PROVIDER_SITE_OTHER): Payer: BC Managed Care – PPO | Admitting: Family

## 2022-03-24 VITALS — BP 124/80 | HR 71 | Temp 98.0°F | Resp 16 | Ht 70.0 in | Wt 198.0 lb

## 2022-03-24 DIAGNOSIS — Z0000 Encounter for general adult medical examination without abnormal findings: Secondary | ICD-10-CM

## 2022-03-24 DIAGNOSIS — Z0001 Encounter for general adult medical examination with abnormal findings: Secondary | ICD-10-CM

## 2022-03-24 DIAGNOSIS — F341 Dysthymic disorder: Secondary | ICD-10-CM

## 2022-03-24 DIAGNOSIS — Z1211 Encounter for screening for malignant neoplasm of colon: Secondary | ICD-10-CM | POA: Diagnosis not present

## 2022-03-24 LAB — COMPREHENSIVE METABOLIC PANEL
ALT: 23 U/L (ref 0–53)
AST: 15 U/L (ref 0–37)
Albumin: 4.2 g/dL (ref 3.5–5.2)
Alkaline Phosphatase: 50 U/L (ref 39–117)
BUN: 18 mg/dL (ref 6–23)
CO2: 28 mEq/L (ref 19–32)
Calcium: 9.1 mg/dL (ref 8.4–10.5)
Chloride: 107 mEq/L (ref 96–112)
Creatinine, Ser: 1.12 mg/dL (ref 0.40–1.50)
GFR: 74.43 mL/min (ref >60.00)
Glucose, Bld: 92 mg/dL (ref 70–99)
Potassium: 4.8 mEq/L (ref 3.5–5.1)
Sodium: 142 mEq/L (ref 135–145)
Total Bilirubin: 0.3 mg/dL (ref 0.2–1.2)
Total Protein: 6.6 g/dL (ref 6.0–8.3)

## 2022-03-24 LAB — LIPID PANEL
Cholesterol: 265 mg/dL — ABNORMAL HIGH (ref 0–200)
HDL: 45.1 mg/dL (ref >39.00)
LDL Cholesterol: 188 mg/dL — ABNORMAL HIGH (ref 0–99)
NonHDL: 220.36
Total CHOL/HDL Ratio: 6
Triglycerides: 160 mg/dL — ABNORMAL HIGH (ref 0.0–149.0)
VLDL: 32 mg/dL (ref 0.0–40.0)

## 2022-03-24 LAB — TSH: TSH: 0.59 u[IU]/mL (ref 0.35–5.50)

## 2022-03-24 MED ORDER — FLUOXETINE HCL 10 MG PO TABS: ORAL_TABLET | ORAL | 0 refills | Status: DC

## 2022-03-24 NOTE — Telephone Encounter (Signed)
Santiago Glad called to inform that patient needs recollect on CBC due to platelet clumps.Called patient left voicemail for patient to return call to schedule a lab appointment labs are in epic.  Need to collect lavender and blue top no charge for recollect.

## 2022-03-24 NOTE — Progress Notes (Signed)
Subjective:   By signing my name below, I, Jonathan Powell, attest that this documentation has been prepared under the direction and in the presence of Debbrah Alar, NP. 03/24/2022    Patient ID: Jonathan Powell, male    DOB: 08-Jun-1965, 55 y.o.   MRN: [id number]  Chief Complaint  Patient presents with   Annual Exam    HPI Patient is in today for a comprehensive physical exam.   Mood: His mood is table while taking 20 mg Prozac daily PO. He is requesting to stop taking it.   TRT: He is on Testosterone replacement treatment. He reports having increased energy while on it. This is being prescribed by an online doctor.   Acute: He denies having any fever, new muscle pain, joint pain, new moles, congestion, sinus pain, sore throat, chest pain, palpations, cough, SOB, wheezing, n/v/d, constipation, blood in stool, dysuria, frequency, weight change, adenopathy, hematuria, depression, anxiety, headaches at this time.  Social history: He has no new surgical procedures to report. He has no changes in family medical history. He does not use drugs, drink alcohol. He is using nicotine patches. He has not smoked a cigarette for the past couple of years and has not vaped since March 2023.   Immunizations: He is UTD on tetanus vaccine. He is not interested in receiving the flu vaccine. He does not have any Covid-19 vaccines and is not interested in receiving any.   Diet: He is not managing a healthy diet. He has gained 12-14 lb's due to his diet.   Exercise: He is not doing regular exercise due to having limited time from work. He is lifting and active during work but is not outside of it.   Colonoscopy: Last completed 09/27/2018. Results showed: - Seven 2 to 9 mm polyps in the sigmoid colon, in the descending colon and in the ascending colon, removed with a cold snare. Resected and retrieved. - One 12 mm polyp in the sigmoid colon, removed with a hot snare. Resected and retrieved. - The  examination was otherwise normal on direct and retroflexion views. - Repeat in 3 years.   PSA: Last completed 01/11/2021. Results is 1.33.   Dental: He is UTD on dental care.   Vision: He is due for vision care and is planning on scheduling an appointment.    Health Maintenance Due  Topic Date Due   COVID-19 Vaccine (1) Never done   Lung Cancer Screening  Never done   COLONOSCOPY (Pts 45-75yr Insurance coverage will need to be confirmed)  09/26/2021    Past Medical History:  Diagnosis Date   Anxiety    GERD (gastroesophageal reflux disease)    History of chicken pox    History of kidney stones    Hyperlipidemia    Pneumonia     Past Surgical History:  Procedure Laterality Date   CHOLECYSTECTOMY     FOOT SURGERY Right 2001   toe surgery    Family History  Problem Relation Age of Onset   Hyperlipidemia Mother    Irritable bowel syndrome Mother    Heart attack Mother    Arthritis Maternal Grandmother    Kidney failure Paternal Grandfather    Alcohol abuse Paternal Grandfather        deceased   ADD / ADHD Son        adopted   Alcohol abuse Father    Kidney Stones Father    Colon cancer Neg Hx    Esophageal cancer Neg Hx  Rectal cancer Neg Hx    Stomach cancer Neg Hx     Social History   Socioeconomic History   Marital status: Married    Spouse name: Not on file   Number of children: 2   Years of education: Not on file   Highest education level: Not on file  Occupational History   Occupation: VP Scientist, clinical (histocompatibility and immunogenetics): [employer]    Comment: self employed  Tobacco Use   Smoking status: Former    Packs/day: 1.00    Types: Cigarettes   Smokeless tobacco: Never   Tobacco comments:    Using nicotine pouches  Substance and Sexual Activity   Alcohol use: Not Currently   Drug use: No   Sexual activity: Yes    Partners: Female  Other Topics Concern   Not on file  Social History Narrative   Occupation: vice Agricultural engineer, security company    Current Smoker-  2 PPD.   Alcohol use-yes,  very rare 1-2 drinks   Regular exercise-yes   Smoking Status:  current   Packs/Day:  1   Caffeine use/day:  3-4 daily   Does Patient Exercise:  Yes   Married- one biological son, one adopted son, step son   Social Determinants of Radio broadcast assistant Strain: Not on file  Food Insecurity: Not on file  Transportation Needs: Not on file  Physical Activity: Not on file  Stress: Not on file  Social Connections: Not on file  Intimate Partner Violence: Not on file    Outpatient Medications Prior to Visit  Medication Sig Dispense Refill   aspirin EC 81 MG tablet Take 1 tablet (81 mg total) by mouth daily.     testosterone cypionate (DEPOTESTOSTERONE CYPIONATE) 200 MG/ML injection Inject into the muscle every 14 (fourteen) days.     FLUoxetine (PROZAC) 20 MG capsule Take 1 capsule by mouth daily. 90 capsule 0   No facility-administered medications prior to visit.    No Known Allergies  ROS See HPI    Objective:    Physical Exam Constitutional:      General: He is not in acute distress.    Appearance: Normal appearance. He is not ill-appearing.  HENT:     Head: Normocephalic and atraumatic.     Right Ear: Tympanic membrane, ear canal and external ear normal.     Left Ear: Tympanic membrane, ear canal and external ear normal.  Eyes:     Extraocular Movements: Extraocular movements intact.     Right eye: No nystagmus.     Left eye: No nystagmus.     Pupils: Pupils are equal, round, and reactive to light.  Cardiovascular:     Rate and Rhythm: Normal rate and regular rhythm.     Heart sounds: Normal heart sounds. No murmur heard.    No gallop.  Pulmonary:     Effort: Pulmonary effort is normal. No respiratory distress.     Breath sounds: Normal breath sounds. No wheezing or rales.  Abdominal:     General: Bowel sounds are normal. There is no distension.     Palpations: Abdomen is soft.     Tenderness: There is no  abdominal tenderness. There is no guarding.  Musculoskeletal:     Comments: 5/5 strength in both upper and lower extremities  Skin:    General: Skin is warm and dry.  Neurological:     Mental Status: He is alert and oriented to person, place, and time.  Deep Tendon Reflexes:     Reflex Scores:      Patellar reflexes are 1+ on the right side and 1+ on the left side. Psychiatric:        Judgment: Judgment normal.     BP 124/80 (BP Location: Right Arm, Patient Position: Sitting, Cuff Size: Small)   Pulse 71   Temp 98 F (36.7 C) (Oral)   Resp 16   Ht 5' 10" (1.778 m)   Wt 198 lb (89.8 kg)   SpO2 99%   BMI 28.41 kg/m  Wt Readings from Last 3 Encounters:  03/24/22 198 lb (89.8 kg)  03/21/21 193 lb 6.4 oz (87.7 kg)  01/11/21 184 lb (83.5 kg)       Assessment & Plan:   Problem List Items Addressed This Visit       Unprioritized   Preventative health care - Primary    Wt Readings from Last 3 Encounters:  03/24/22 198 lb (89.8 kg)  03/21/21 193 lb 6.4 oz (87.7 kg)  01/11/21 184 lb (83.5 kg)  Encouraged him to increase exercise, diet is "terrible."  Pack healthy lunches for work.  Refer for follow up colo. PSA up to date. Lab Results  Component Value Date   PSA1 1.6 06/03/2021   PSA 1.33 01/11/2021   PSA 1.1 07/19/2018   PSA 1.1 06/29/2017         Relevant Orders   CBC with Differential/Platelet   TSH   Comp Met (CMET)   Lipid panel   ANXIETY DEPRESSION    Stable. Wishes to wean off of prozac. Will decrease from 68m to 169mx 3 weeks, then 70m2m 2 weeks then stop. He will reach out to me and let me know how he does mood wise with this taper.       Relevant Medications   FLUoxetine (PROZAC) 10 MG tablet   Other Visit Diagnoses     Colon cancer screening       Relevant Orders   Ambulatory referral to Gastroenterology        Meds ordered this encounter  Medications   FLUoxetine (PROZAC) 10 MG tablet    Sig: Take 1 tablet by mouth once daily for 3  weeks then 1/2 tab once daily for 2 weeks then stop    Dispense:  30 tablet    Refill:  0    Order Specific Question:   Supervising Provider    Answer:   BLYMosie [id number]]    I, Jonathan Powell, personally preformed the services described in this documentation.  All medical record entries made by the scribe were at my direction and in my presence.  I have reviewed the chart and discharge instructions (if applicable) and agree that the record reflects my personal performance and is accurate and complete. 03/24/2022   I,Jonathan Powell,acting as a scrEducation administratorr Jonathan Powell.,have documented all relevant documentation on the behalf of Jonathan Powell,as directed by  Jonathan Powell while in the presence of Jonathan Powell.   Jonathan Powell

## 2022-03-24 NOTE — Assessment & Plan Note (Signed)
Stable. Wishes to wean off of prozac. Will decrease from '20mg'$  to '10mg'$  x 3 weeks, then '5mg'$  x 2 weeks then stop. He will reach out to me and let me know how he does mood wise with this taper.

## 2022-03-24 NOTE — Addendum Note (Signed)
Addended by: Manuela Schwartz on: 03/24/2022 04:50 PM   Modules accepted: Orders

## 2022-03-24 NOTE — Assessment & Plan Note (Addendum)
Wt Readings from Last 3 Encounters:  03/24/22 198 lb (89.8 kg)  03/21/21 193 lb 6.4 oz (87.7 kg)  01/11/21 184 lb (83.5 kg)   Encouraged him to increase exercise, diet is "terrible."  Pack healthy lunches for work.  Refer for follow up colo. PSA up to date. Lab Results  Component Value Date   PSA1 1.6 06/03/2021   PSA 1.33 01/11/2021   PSA 1.1 07/19/2018   PSA 1.1 06/29/2017

## 2022-03-26 ENCOUNTER — Telehealth: Payer: Self-pay | Admitting: Family

## 2022-03-26 DIAGNOSIS — E785 Hyperlipidemia, unspecified: Secondary | ICD-10-CM

## 2022-03-26 NOTE — Telephone Encounter (Signed)
Cholesterol is very high- it was much better when he was taking cholesterol medication.  I would recommend that he restart atorvastatin '20mg'$  once daily to decrease his risk of heart attack and stroke.   Repeat LFT/Lipid panel dx hyperlipidemia in 6 weeks.

## 2022-03-27 NOTE — Telephone Encounter (Signed)
Called but no answer, lvm for patient to call back 

## 2022-03-28 ENCOUNTER — Telehealth: Payer: Self-pay | Admitting: *Deleted

## 2022-03-28 NOTE — Telephone Encounter (Signed)
Patient advised of results and provider's recommendations. He will like to stay off the medication for now and will work on his diet. He was scheduled for labs 06/15/21 at his request.

## 2022-03-28 NOTE — Telephone Encounter (Signed)
Lvm again for patient to call back

## 2022-03-28 NOTE — Telephone Encounter (Signed)
Prior auth started via cover my meds.  Awaiting determination.  Key: [id number]

## 2022-03-29 NOTE — Telephone Encounter (Signed)
Prior Jonathan Powell was denied.  Not sure of why.  Fax should be coming in.   He can use [facility] and get for around 19.50.

## 2022-05-20 ENCOUNTER — Encounter: Payer: Self-pay | Admitting: Family

## 2022-05-21 MED ORDER — ATORVASTATIN CALCIUM 20 MG PO TABS: 20.0000 mg | ORAL_TABLET | Freq: Every day | ORAL | 1 refills | Status: DC

## 2022-06-08 ENCOUNTER — Encounter: Payer: Self-pay | Admitting: Family

## 2022-06-15 ENCOUNTER — Other Ambulatory Visit: Payer: BC Managed Care – PPO

## 2022-11-29 ENCOUNTER — Other Ambulatory Visit: Payer: Self-pay | Admitting: Family

## 2022-12-04 ENCOUNTER — Encounter: Payer: Self-pay | Admitting: Family

## 2022-12-04 MED ORDER — ATORVASTATIN CALCIUM 20 MG PO TABS: 20.0000 mg | ORAL_TABLET | Freq: Every day | ORAL | 0 refills | Status: DC

## 2022-12-13 ENCOUNTER — Ambulatory Visit (INDEPENDENT_AMBULATORY_CARE_PROVIDER_SITE_OTHER): Payer: Self-pay | Admitting: Family

## 2022-12-13 ENCOUNTER — Encounter: Payer: Self-pay | Admitting: Family

## 2022-12-13 VITALS — BP 136/82 | HR 70 | Resp 18 | Ht 70.0 in | Wt 188.0 lb

## 2022-12-13 DIAGNOSIS — E785 Hyperlipidemia, unspecified: Secondary | ICD-10-CM

## 2022-12-13 DIAGNOSIS — F341 Dysthymic disorder: Secondary | ICD-10-CM

## 2022-12-13 DIAGNOSIS — Z87891 Personal history of nicotine dependence: Secondary | ICD-10-CM

## 2022-12-13 DIAGNOSIS — R7989 Other specified abnormal findings of blood chemistry: Secondary | ICD-10-CM

## 2022-12-13 LAB — COMPREHENSIVE METABOLIC PANEL
ALT: 22 U/L (ref 0–53)
AST: 14 U/L (ref 0–37)
Albumin: 4.2 g/dL (ref 3.5–5.2)
Alkaline Phosphatase: 57 U/L (ref 39–117)
BUN: 18 mg/dL (ref 6–23)
CO2: 30 mEq/L (ref 19–32)
Calcium: 9.4 mg/dL (ref 8.4–10.5)
Chloride: 103 mEq/L (ref 96–112)
Creatinine, Ser: 1.15 mg/dL (ref 0.40–1.50)
GFR: 71.74 mL/min (ref >60.00)
Glucose, Bld: 130 mg/dL — ABNORMAL HIGH (ref 70–99)
Potassium: 4.7 mEq/L (ref 3.5–5.1)
Sodium: 140 mEq/L (ref 135–145)
Total Bilirubin: 0.5 mg/dL (ref 0.2–1.2)
Total Protein: 6.7 g/dL (ref 6.0–8.3)

## 2022-12-13 LAB — LIPID PANEL
Cholesterol: 212 mg/dL — ABNORMAL HIGH (ref 0–200)
HDL: 41.4 mg/dL (ref >39.00)
LDL Cholesterol: 152 mg/dL — ABNORMAL HIGH (ref 0–99)
NonHDL: 170.29
Total CHOL/HDL Ratio: 5
Triglycerides: 93 mg/dL (ref 0.0–149.0)
VLDL: 18.6 mg/dL (ref 0.0–40.0)

## 2022-12-13 MED ORDER — FLUOXETINE HCL 10 MG PO TABS: 10.0000 mg | ORAL_TABLET | Freq: Every day | ORAL | 0 refills | Status: DC

## 2022-12-13 NOTE — Assessment & Plan Note (Signed)
Patient reports having quit nicotine over a year ago and is currently using caffeine patches. -Encourage continued abstinence from nicotine.

## 2022-12-13 NOTE — Progress Notes (Signed)
Subjective:     Patient ID: Jonathan Powell, male    DOB: 1965-10-27, 57 y.o.   MRN: [id number]  Chief Complaint  Patient presents with   Follow-up    HPI  Discussed the use of AI scribe software for clinical note transcription with the patient, who gave verbal consent to proceed.  History of Present Illness   The patient, with a history of depression and anxiety, previously managed with Prozac, presents with increased edginess and anxiety. He describes a quick temper, going from 'zero to a hundred really fast,' and constant worry about mundane issues. He also reports waking up early in the morning and being unable to fall back asleep. He wonders if these symptoms could be related to his testosterone therapy. He has been off Prozac for a while and did not have any noticeable issues weaning off. However, he is considering restarting it due to his current symptoms.  The patient also reports a high-stress lifestyle due to selling his house and moving, along with his job which involves unpredictable situations. He also mentions that he is a 'doer and a goer' and has difficulty sitting still, even to watch a movie.  The patient has stopped vaping and has not had any nicotine in over a year. He is currently using patches that contain caffeine. He is also on testosterone therapy, which is prescribed by an online MD and monitored every six months with blood work. He is also taking atorvastatin for his cholesterol.       Lab Results  Component Value Date   CHOL 265 (H) 03/24/2022   HDL 45.10 03/24/2022   LDLCALC 188 (H) 03/24/2022   TRIG 160.0 (H) 03/24/2022   CHOLHDL 6 03/24/2022      Health Maintenance Due  Topic Date Due   Lung Cancer Screening  Never done   Colonoscopy  09/26/2021   COVID-19 Vaccine (1 - 2023-24 season) Never done    Past Medical History:  Diagnosis Date   Anxiety    GERD (gastroesophageal reflux disease)    History of chicken pox    History of kidney  stones    Hyperlipidemia    Pneumonia     Past Surgical History:  Procedure Laterality Date   CHOLECYSTECTOMY     FOOT SURGERY Right 2001   toe surgery    Family History  Problem Relation Age of Onset   Hyperlipidemia Mother    Irritable bowel syndrome Mother    Heart attack Mother    Arthritis Maternal Grandmother    Kidney failure Paternal Grandfather    Alcohol abuse Paternal Grandfather        deceased   ADD / ADHD Son        adopted   Alcohol abuse Father    Kidney Stones Father    Colon cancer Neg Hx    Esophageal cancer Neg Hx    Rectal cancer Neg Hx    Stomach cancer Neg Hx     Social History   Socioeconomic History   Marital status: Married    Spouse name: Not on file   Number of children: 2   Years of education: Not on file   Highest education level: GED or equivalent  Occupational History   Occupation: VP Investment banker, corporate: [employer]    Comment: self employed  Tobacco Use   Smoking status: Former    Types: Cigarettes   Smokeless tobacco: Never  Vaping Use   Vaping status: Former  Substance and Sexual Activity   Alcohol use: Not Currently   Drug use: No   Sexual activity: Yes    Partners: Female  Other Topics Concern   Not on file  Social History Narrative   Occupation: vice Engineer, structural, security company   Current Smoker-  2 PPD.   Alcohol use-yes,  very rare 1-2 drinks   Regular exercise-yes   Smoking Status:  current   Packs/Day:  1   Caffeine use/day:  3-4 daily   Does Patient Exercise:  Yes   Married- one biological son, one adopted son, step son   Social Determinants of Health   Financial Resource Strain: Low Risk  (12/12/2022)   Overall Financial Resource Strain (CARDIA)    Difficulty of Paying Living Expenses: Not very hard  Food Insecurity: No Food Insecurity (12/12/2022)   Hunger Vital Sign    Worried About Running Out of Food in the Last Year: Never true    Ran Out of Food in the Last Year: Never true   Transportation Needs: No Transportation Needs (12/12/2022)   PRAPARE - Administrator, Civil Service (Medical): No    Lack of Transportation (Non-Medical): No  Physical Activity: Sufficiently Active (12/12/2022)   Exercise Vital Sign    Days of Exercise per Week: 7 days    Minutes of Exercise per Session: 120 min  Stress: Stress Concern Present (12/12/2022)   [employer] of Occupational Health - Occupational Stress Questionnaire    Feeling of Stress : To some extent  Social Connections: Unknown (12/12/2022)   Social Connection and Isolation Panel [NHANES]    Frequency of Communication with Friends and Family: Three times a week    Frequency of Social Gatherings with Friends and Family: Three times a week    Attends Religious Services: Patient declined    Active Member of Clubs or Organizations: No    Attends Engineer, structural: Not on file    Marital Status: Married  Catering manager Violence: Not on file    Outpatient Medications Prior to Visit  Medication Sig Dispense Refill   aspirin EC 81 MG tablet Take 1 tablet (81 mg total) by mouth daily.     atorvastatin (LIPITOR) 20 MG tablet Take 1 tablet (20 mg total) by mouth daily. 30 tablet 0   testosterone cypionate (DEPOTESTOSTERONE CYPIONATE) 200 MG/ML injection Inject into the muscle every 14 (fourteen) days.     FLUoxetine (PROZAC) 10 MG tablet Take 1 tablet by mouth once daily for 3 weeks then 1/2 tab once daily for 2 weeks then stop 30 tablet 0   No facility-administered medications prior to visit.    No Known Allergies  ROS See HPI    Objective:    Physical Exam Constitutional:      General: He is not in acute distress.    Appearance: He is well-developed.  HENT:     Head: Normocephalic and atraumatic.  Cardiovascular:     Rate and Rhythm: Normal rate and regular rhythm.     Heart sounds: No murmur heard. Pulmonary:     Effort: Pulmonary effort is normal. No respiratory distress.     Breath  sounds: Normal breath sounds. No wheezing or rales.  Skin:    General: Skin is warm and dry.  Neurological:     Mental Status: He is alert and oriented to person, place, and time.  Psychiatric:        Behavior: Behavior normal.  Thought Content: Thought content normal.      BP 136/82   Pulse 70   Resp 18   Ht 5\' 10"  (1.778 m)   Wt 188 lb (85.3 kg)   SpO2 99%   BMI 26.98 kg/m  Wt Readings from Last 3 Encounters:  12/13/22 188 lb (85.3 kg)  03/24/22 198 lb (89.8 kg)  03/21/21 193 lb 6.4 oz (87.7 kg)       Assessment & Plan:   Problem List Items Addressed This Visit       Unprioritized   Low testosterone in male    Patient is currently on testosterone replacement therapy and reports no issues. -Continue current management with online MD.      Hyperlipidemia - Primary    Tolerating atorvastatin. Obtain follow up lipid panel.       Relevant Orders   Lipid panel   Comp Met (CMET)   History of nicotine dependence    Patient reports having quit nicotine over a year ago and is currently using caffeine patches. -Encourage continued abstinence from nicotine.      ANXIETY DEPRESSION    Uncontrolled. Patient reports increased edginess, anxiety, and irritability. Previously managed on Prozac, but has been off medication for some time. Patient is interested in restarting prozac.  -Reevaluate in 6 weeks to assess response to medication.      Relevant Medications   FLUoxetine (PROZAC) 10 MG tablet    I have changed Dorothey Baseman. Towers "Michael"'s FLUoxetine. I am also having him maintain his aspirin EC, testosterone cypionate, and atorvastatin.  Meds ordered this encounter  Medications   FLUoxetine (PROZAC) 10 MG tablet    Sig: Take 1 tablet (10 mg total) by mouth daily.    Dispense:  90 tablet    Refill:  0    Order Specific Question:   Supervising Provider    Answer:   Danise Edge A [[id number]]

## 2022-12-13 NOTE — Assessment & Plan Note (Signed)
Tolerating atorvastatin. Obtain follow up lipid panel.

## 2022-12-13 NOTE — Assessment & Plan Note (Signed)
Uncontrolled. Patient reports increased edginess, anxiety, and irritability. Previously managed on Prozac, but has been off medication for some time. Patient is interested in restarting prozac.  -Reevaluate in 6 weeks to assess response to medication.

## 2022-12-13 NOTE — Assessment & Plan Note (Signed)
Patient is currently on testosterone replacement therapy and reports no issues. -Continue current management with online MD.

## 2022-12-13 NOTE — Patient Instructions (Signed)
VISIT SUMMARY:  During our visit, we discussed your increased feelings of edginess and anxiety, your cholesterol management, slightly elevated blood pressure, testosterone replacement therapy, and your successful abstinence from nicotine. You also mentioned your high-stress lifestyle due to selling your house, moving, and your job's unpredictable situations.  YOUR PLAN:  -ANXIETY AND IRRITABILITY: You've been feeling more anxious and irritable lately. We've decided to restart your Prozac medication, which helped manage these feelings in the past. We'll check in about 6 weeks to see how you're responding to the medication.  -HIGH CHOLESTEROL: You're currently taking Atorvastatin for your cholesterol. We'll order a lipid panel, a type of blood test, to see how well the medication is working. Depending on the results, we may adjust your Atorvastatin dose.  -TESTOSTERONE REPLACEMENT THERAPY: You're currently on testosterone replacement therapy and haven't reported any issues. Please continue follow up with your online MD for this.   -NICOTINE DEPENDENCE: You've successfully quit nicotine over a year ago and are now using caffeine patches. I encourage you to continue staying away from nicotine.

## 2022-12-14 ENCOUNTER — Telehealth: Payer: Self-pay | Admitting: Family

## 2022-12-14 DIAGNOSIS — E7800 Pure hypercholesterolemia, unspecified: Secondary | ICD-10-CM

## 2022-12-14 DIAGNOSIS — R739 Hyperglycemia, unspecified: Secondary | ICD-10-CM

## 2022-12-14 NOTE — Telephone Encounter (Signed)
Cholesterol is improving but still above goal.  Will increase atorvastatin to 40mg .  Repeat lipids in 6 weeks.   Sugar was also elevated.  Please work on healthy diet and exercise.  Let's add A1C dx hyperglycemia to 6 week labs.

## 2022-12-15 NOTE — Telephone Encounter (Signed)
Patient notified of results and medication dose increase. He was scheduled to return for labs 9/23

## 2023-01-07 ENCOUNTER — Other Ambulatory Visit: Payer: Self-pay | Admitting: Family

## 2023-01-29 ENCOUNTER — Other Ambulatory Visit: Payer: BLUE CROSS/BLUE SHIELD

## 2023-03-11 ENCOUNTER — Other Ambulatory Visit: Payer: Self-pay | Admitting: Family

## 2023-06-24 ENCOUNTER — Other Ambulatory Visit: Payer: Self-pay | Admitting: Family

## 2023-07-31 ENCOUNTER — Other Ambulatory Visit: Payer: Self-pay | Admitting: Family

## 2023-11-16 ENCOUNTER — Other Ambulatory Visit: Payer: Self-pay | Admitting: Family

## 2023-11-16 MED ORDER — FLUOXETINE HCL 10 MG PO TABS: 10.0000 mg | ORAL_TABLET | Freq: Every day | ORAL | 0 refills | Status: DC

## 2023-11-16 NOTE — Telephone Encounter (Signed)
 Copied from CRM [id number]. Topic: Clinical - Medication Refill >> Nov 16, 2023 12:30 PM Chiquita SQUIBB wrote: Medication: FLUoxetine  (PROZAC ) 10 MG tablet [[id number]]  Has the patient contacted their pharmacy? Yes- Pharmacy stated it was denied  (Agent: If no, request that the patient contact the pharmacy for the refill. If patient does not wish to contact the pharmacy document the reason why and proceed with request.) (Agent: If yes, when and what did the pharmacy advise?)  This is the patient's preferred pharmacy:  [id number] [place], [place] - [address] AT [facility] OF [facility] & [address] [place] [address] Phone: [phone] Fax: [phone]    Is this the correct pharmacy for this prescription? Yes If no, delete pharmacy and type the correct one.   Has the prescription been filled recently? No  Is the patient out of the medication? Yes  Has the patient been seen for an appointment in the last year OR does the patient have an upcoming appointment? No  Can we respond through MyChart? Yes  Agent: Please be advised that Rx refills may take up to 3 business days. We ask that you follow-up with your pharmacy.

## 2023-11-20 ENCOUNTER — Encounter: Payer: Self-pay | Admitting: Family

## 2023-12-17 ENCOUNTER — Other Ambulatory Visit: Payer: Self-pay | Admitting: Family

## 2023-12-17 DIAGNOSIS — F341 Dysthymic disorder: Secondary | ICD-10-CM

## 2024-01-14 ENCOUNTER — Ambulatory Visit (INDEPENDENT_AMBULATORY_CARE_PROVIDER_SITE_OTHER): Payer: Self-pay | Admitting: Family

## 2024-01-14 ENCOUNTER — Encounter: Payer: Self-pay | Admitting: Family

## 2024-01-14 VITALS — BP 112/76 | HR 71 | Temp 98.8°F | Resp 16 | Ht 70.0 in | Wt 183.0 lb

## 2024-01-14 DIAGNOSIS — R739 Hyperglycemia, unspecified: Secondary | ICD-10-CM

## 2024-01-14 DIAGNOSIS — Z87891 Personal history of nicotine dependence: Secondary | ICD-10-CM

## 2024-01-14 DIAGNOSIS — Z125 Encounter for screening for malignant neoplasm of prostate: Secondary | ICD-10-CM

## 2024-01-14 DIAGNOSIS — F341 Dysthymic disorder: Secondary | ICD-10-CM

## 2024-01-14 DIAGNOSIS — Z1211 Encounter for screening for malignant neoplasm of colon: Secondary | ICD-10-CM

## 2024-01-14 DIAGNOSIS — E785 Hyperlipidemia, unspecified: Secondary | ICD-10-CM

## 2024-01-14 DIAGNOSIS — Z0000 Encounter for general adult medical examination without abnormal findings: Secondary | ICD-10-CM

## 2024-01-14 LAB — COMPREHENSIVE METABOLIC PANEL WITH GFR
ALT: 21 U/L (ref 0–53)
AST: 17 U/L (ref 0–37)
Albumin: 4 g/dL (ref 3.5–5.2)
Alkaline Phosphatase: 78 U/L (ref 39–117)
BUN: 10 mg/dL (ref 6–23)
CO2: 28 meq/L (ref 19–32)
Calcium: 9.3 mg/dL (ref 8.4–10.5)
Chloride: 106 meq/L (ref 96–112)
Creatinine, Ser: 1.03 mg/dL (ref 0.40–1.50)
GFR: 81.26 mL/min (ref >60.00)
Glucose, Bld: 97 mg/dL (ref 70–99)
Potassium: 4.6 meq/L (ref 3.5–5.1)
Sodium: 141 meq/L (ref 135–145)
Total Bilirubin: 0.5 mg/dL (ref 0.2–1.2)
Total Protein: 6.4 g/dL (ref 6.0–8.3)

## 2024-01-14 LAB — LIPID PANEL
Cholesterol: 146 mg/dL (ref 0–200)
HDL: 44.5 mg/dL (ref >39.00)
LDL Cholesterol: 92 mg/dL (ref 0–99)
NonHDL: 101.34
Total CHOL/HDL Ratio: 3
Triglycerides: 48 mg/dL (ref 0.0–149.0)
VLDL: 9.6 mg/dL (ref 0.0–40.0)

## 2024-01-14 LAB — HEMOGLOBIN A1C: Hgb A1c MFr Bld: 6.3 % (ref 4.6–6.5)

## 2024-01-14 LAB — PSA: PSA: 1.54 ng/mL (ref 0.10–4.00)

## 2024-01-14 MED ORDER — ATORVASTATIN CALCIUM 20 MG PO TABS: 20.0000 mg | ORAL_TABLET | Freq: Every day | ORAL | 1 refills | Status: AC

## 2024-01-14 MED ORDER — FLUOXETINE HCL 10 MG PO TABS: 10.0000 mg | ORAL_TABLET | Freq: Every day | ORAL | 1 refills | Status: AC

## 2024-01-14 NOTE — Assessment & Plan Note (Addendum)
 Maintained on atorvastatin .  Lab Results  Component Value Date   CHOL 212 (H) 12/13/2022   HDL 41.40 12/13/2022   LDLCALC 152 (H) 12/13/2022   TRIG 93.0 12/13/2022   CHOLHDL 5 12/13/2022   Update lipid panel. He has been working hard on [employer] and regular exercise.

## 2024-01-14 NOTE — Patient Instructions (Signed)
 VISIT SUMMARY:  Today, you had your annual physical exam. We discussed your previous colonoscopy results, your recent weight loss, and your current health status, including your blood sugar levels and smoking history. We also addressed your irritability after stopping Prozac  and your ongoing foot pain.  YOUR PLAN:  ADULT WELLNESS VISIT: Routine wellness visit focused on cancer screenings and preventive care. -You are referred for a colonoscopy due to the previous large polyp and multiple polyps found in 2020. A colonoscopy is recommended over at-home stool tests due to your higher risk. -A CT scan for lung cancer screening has been ordered due to your 30-year smoking history and quitting in 2021. -A PSA test has been ordered as part of your routine screening.  IRRITABILITY DUE TO DISCONTINUATION OF FLUOXETINE : You have experienced irritability and mood changes after stopping fluoxetine  two months ago. -Fluoxetine  has been prescribed to help manage your irritability. -Please follow up in 6 months to monitor your response to the medication.  HYPERLIPIDEMIA: We discussed your previous statin dosage and your recent weight loss and good blood pressure control. -A lipid panel has been ordered to reassess your statin dosage based on the lab results.  HYPERGLYCEMIA: Your blood sugar levels have been elevated in previous labs. -A blood glucose and diabetes test has been ordered to assess your current status.  NICOTINE DEPENDENCE, IN REMISSION: You have been nicotine-free since 2021 after 30 years of smoking. -Continue to stay nicotine-free and avoid vaping products.  GENERAL HEALTH MAINTENANCE: We discussed your immunization updates and overall health maintenance. -You declined the hepatitis B and Prevnar vaccines, so they have been removed from your list. -Continue with your regular exercise and healthy diet.

## 2024-01-14 NOTE — Progress Notes (Signed)
 Subjective:     Patient ID: Jonathan Powell, male    DOB: May 12, 1965, 58 y.o.   MRN: [id number]  Chief Complaint  Patient presents with   Annual Exam    HPI  Discussed the use of AI scribe software for clinical note transcription with the patient, who gave verbal consent to proceed.  History of Present Illness   Jonathan Powell is a 58 year old male who presents for an annual physical exam. He had a colonoscopy in 2020, which revealed a 12mm polyp and seven other polyps. He prefers at-home testing due to discomfort with the procedure. His blood sugar levels have been elevated in some labs, and he is due for a repeat test today. He has lost approximately 30 pounds since his last visit and maintains a healthy diet and daily exercise. He smoked for 30 years, starting at age 60, and quit in 2021. He smoked about a pack a day on average and is currently nicotine-free without using vaping products. He has been off Prozac  for about two months due to prescription issues and experiences increased irritability, describing himself as going from 'zero to a hundred really fast.' He has been on Prozac  for a long time to manage irritability. His feet hurt all the time, which he attributes to being on them frequently due to his work.   Immunizations: 5/20, due Diet: healthy Exercise: regular Colonoscopy: Vision: up to date Dental: up to date Lung cancer screening: PSA:   Lab Results  Component Value Date   PSA1 1.6 06/03/2021   PSA 1.33 01/11/2021   PSA 1.1 07/19/2018   PSA 1.1 06/29/2017       01/14/2024   11:27 AM 09/03/2019   11:24 AM 07/19/2018    3:42 PM 06/29/2017    3:36 PM 05/12/2016    5:05 PM  Depression screen PHQ 2/9  Decreased Interest 0 0 0 0 0  Down, Depressed, Hopeless 0 0 0 0 0  PHQ - 2 Score 0 0 0 0 0  Altered sleeping 0 1 0 2 1  Tired, decreased energy 0 2 0 0 1  Change in appetite 0 0 0 0 0  Feeling bad or failure about yourself  0 0 0 0 0  Trouble  concentrating 0 0 0 0 0  Moving slowly or fidgety/restless 0 0 0 0 0  Suicidal thoughts 0 0 0 0 0   PHQ-9 Score 0 3 0 2 2  Difficult doing work/chores Not difficult at all Not difficult at all Not difficult at all Not difficult at all      Data saved with a previous flowsheet row definition      03/17/2019   10:35 AM  GAD 7 : Generalized Anxiety Score  Nervous, Anxious, on Edge 1  Control/stop worrying 1  Worry too much - different things 1  Trouble relaxing 2  Restless 0  Easily annoyed or irritable 1  Afraid - awful might happen 1  Total GAD 7 Score 7  Anxiety Difficulty Not difficult at all         Health Maintenance Due  Topic Date Due   Hepatitis B Vaccines 19-59 Average Risk (1 of 3 - 19+ 3-dose series) Never done   Pneumococcal Vaccine: 50+ Years (1 of 1 - PCV) Never done   Colonoscopy  09/26/2021   COVID-19 Vaccine (2 - 2025-26 season) 01/07/2024    Past Medical History:  Diagnosis Date   Anxiety    GERD (  gastroesophageal reflux disease)    History of chicken pox    History of kidney stones    Hyperlipidemia    Pneumonia     Past Surgical History:  Procedure Laterality Date   CHOLECYSTECTOMY     FOOT SURGERY Right 2001   toe surgery    Family History  Problem Relation Age of Onset   Hyperlipidemia Mother    Irritable bowel syndrome Mother    Heart attack Mother    Arthritis Maternal Grandmother    Kidney failure Paternal Grandfather    Alcohol abuse Paternal Grandfather        deceased   ADD / ADHD Son        adopted   Alcohol abuse Father    Kidney Stones Father    Colon cancer Neg Hx    Esophageal cancer Neg Hx    Rectal cancer Neg Hx    Stomach cancer Neg Hx     Social History   Socioeconomic History   Marital status: Married    Spouse name: Not on file   Number of children: 2   Years of education: Not on file   Highest education level: GED or equivalent  Occupational History   Occupation: VP Investment banker, corporate: [employer]    Comment: self employed  Tobacco Use   Smoking status: Former    Current packs/day: 1.00    Average packs/day: 1 pack/day for 4.7 years (4.7 ttl pk-yrs)    Types: Cigarettes    Start date: 05/09/2019    Quit date: 05/09/1979   Smokeless tobacco: Never  Vaping Use   Vaping status: Former  Substance and Sexual Activity   Alcohol use: Not Currently   Drug use: No   Sexual activity: Yes    Partners: Female  Other Topics Concern   Not on file  Social History Narrative   Occupation: vice Engineer, structural, security company   Current Smoker-  2 PPD.   Alcohol use-yes,  very rare 1-2 drinks   Regular exercise-yes   Smoking Status:  current   Packs/Day:  1   Caffeine use/day:  3-4 daily   Does Patient Exercise:  Yes   Married- one biological son, one adopted son, step son   Social Drivers of Corporate investment banker Strain: Low Risk  (01/12/2024)   Overall Financial Resource Strain (CARDIA)    Difficulty of Paying Living Expenses: Not very hard  Food Insecurity: No Food Insecurity (01/12/2024)   Hunger Vital Sign    Worried About Running Out of Food in the Last Year: Never true    Ran Out of Food in the Last Year: Never true  Transportation Needs: No Transportation Needs (01/12/2024)   PRAPARE - Administrator, Civil Service (Medical): No    Lack of Transportation (Non-Medical): No  Physical Activity: Sufficiently Active (01/12/2024)   Exercise Vital Sign    Days of Exercise per Week: 7 days    Minutes of Exercise per Session: 90 min  Stress: No Stress Concern Present (01/12/2024)   [employer] of Occupational Health - Occupational Stress Questionnaire    Feeling of Stress: Only a little  Social Connections: Moderately Isolated (01/12/2024)   Social Connection and Isolation Panel    Frequency of Communication with Friends and Family: Twice a week    Frequency of Social Gatherings with Friends and Family: Twice a week    Attends Religious Services: Never     Active Member of  Clubs or Organizations: No    Attends Engineer, structural: Not on file    Marital Status: Married  Catering manager Violence: Not on file    Outpatient Medications Prior to Visit  Medication Sig Dispense Refill   atorvastatin  (LIPITOR) 20 MG tablet TAKE 1 TABLET(20 MG) BY MOUTH DAILY 90 tablet 1   FLUoxetine  (PROZAC ) 10 MG tablet Take 1 tablet (10 mg total) by mouth daily. Needs office visit prior to additional refills 14 tablet 0   aspirin  EC 81 MG tablet Take 1 tablet (81 mg total) by mouth daily.     omeprazole  (PRILOSEC) 20 MG capsule Take 1 capsule (20 mg total) by mouth daily. 30 capsule 0   testosterone  cypionate (DEPOTESTOSTERONE CYPIONATE) 200 MG/ML injection Inject into the muscle every 14 (fourteen) days.     No facility-administered medications prior to visit.    No Known Allergies  Review of Systems  Constitutional:  Positive for weight loss.  HENT:  Negative for congestion and hearing loss.   Eyes:  Negative for blurred vision.  Respiratory:  Negative for cough.   Cardiovascular:  Negative for leg swelling.  Gastrointestinal:  Negative for constipation and diarrhea.  Genitourinary:  Negative for dysuria and frequency.  Musculoskeletal:  Negative for joint pain and myalgias.  Skin:  Negative for rash.  Neurological:  Negative for headaches.  Psychiatric/Behavioral:         Mood stable       Objective:    Physical Exam   BP 112/76 (BP Location: Right Arm, Patient Position: Sitting, Cuff Size: Normal)   Pulse 71   Temp 98.8 F (37.1 C) (Oral)   Resp 16   Ht 5' 10 (1.778 m)   Wt 183 lb (83 kg)   SpO2 100%   BMI 26.26 kg/m  Wt Readings from Last 3 Encounters:  01/14/24 183 lb (83 kg)  12/13/22 188 lb (85.3 kg)  03/24/22 198 lb (89.8 kg)   Physical Exam  Constitutional: He is oriented to person, place, and time. He appears well-developed and well-nourished. No distress.  HENT:  Head: Normocephalic and atraumatic.  Right  Ear: Tympanic membrane and ear canal normal.  Left Ear: Tympanic membrane and ear canal normal.  Mouth/Throat: Oropharynx is clear and moist.  Eyes: Pupils are equal, round, and reactive to light. No scleral icterus.  Neck: Normal range of motion. No thyromegaly present.  Cardiovascular: Normal rate and regular rhythm.   No murmur heard. Pulmonary/Chest: Effort normal and breath sounds normal. No respiratory distress. He has no wheezes. He has no rales. He exhibits no tenderness.  Abdominal: Soft. Bowel sounds are normal. He exhibits no distension and no mass. There is no tenderness. There is no rebound and no guarding.  Musculoskeletal: He exhibits no edema.  Lymphadenopathy:    He has no cervical adenopathy.  Neurological: He is alert and oriented to person, place, and time. He has normal patellar reflexes. He exhibits normal muscle tone. Coordination normal.  Skin: Skin is warm and dry.  Psychiatric: He has a normal mood and affect. His behavior is normal. Judgment and thought content normal.           Assessment & Plan:       Assessment & Plan:   Problem List Items Addressed This Visit       Unprioritized   Preventative health care - Primary    Routine wellness visit focused on cancer screenings and preventive care.  - Refer for colonoscopy due to previous  large polyp and multiple polyps found in 2020. Advised colonoscopy over at-home stool test due to high risk.  - Order CT for lung cancer screening due to 30-year smoking history and quitting in 2021.  - Order PSA test as part of routine screening.        Hyperlipidemia   Maintained on atorvastatin .  Lab Results  Component Value Date   CHOL 212 (H) 12/13/2022   HDL 41.40 12/13/2022   LDLCALC 152 (H) 12/13/2022   TRIG 93.0 12/13/2022   CHOLHDL 5 12/13/2022   Update lipid panel. He has been working hard on [employer] and regular exercise.       Relevant Medications   atorvastatin  (LIPITOR) 20 MG tablet    Other Relevant Orders   Lipid panel   ANXIETY DEPRESSION   Notes increased irritability since he ran out of prozac .  Felt better on prozac .  Will restart.       Relevant Medications   FLUoxetine  (PROZAC ) 10 MG tablet   Other Visit Diagnoses       Screening for prostate cancer       Relevant Orders   PSA     Screening for colon cancer       Relevant Orders   Ambulatory referral to Gastroenterology     History of tobacco abuse       Relevant Orders   CT CHEST LUNG CA SCREEN LOW DOSE W/O CM     Hyperglycemia       Relevant Orders   Comp Met (CMET)   HgB A1c       I have discontinued Lupita HERO. Fetch Michael's omeprazole , aspirin  EC, and testosterone  cypionate. I have also changed his FLUoxetine  and atorvastatin .  Meds ordered this encounter  Medications   FLUoxetine  (PROZAC ) 10 MG tablet    Sig: Take 1 tablet (10 mg total) by mouth daily.    Dispense:  90 tablet    Refill:  1    Supervising Provider:   DOMENICA BLACKBIRD A [[id number]]   atorvastatin  (LIPITOR) 20 MG tablet    Sig: Take 1 tablet (20 mg total) by mouth daily.    Dispense:  90 tablet    Refill:  1    Supervising Provider:   DOMENICA BLACKBIRD A [[id number]]

## 2024-01-14 NOTE — Assessment & Plan Note (Signed)
  Routine wellness visit focused on cancer screenings and preventive care.  - Refer for colonoscopy due to previous large polyp and multiple polyps found in 2020. Advised colonoscopy over at-home stool test due to high risk.  - Order CT for lung cancer screening due to 30-year smoking history and quitting in 2021.  - Order PSA test as part of routine screening.

## 2024-01-14 NOTE — Assessment & Plan Note (Signed)
 Notes increased irritability since he ran out of prozac .  Felt better on prozac .  Will restart.

## 2024-01-15 ENCOUNTER — Ambulatory Visit: Payer: Self-pay | Admitting: Family

## 2024-01-15 DIAGNOSIS — R7303 Prediabetes: Secondary | ICD-10-CM | POA: Insufficient documentation

## 2024-01-15 NOTE — Telephone Encounter (Signed)
 Cholesterol looks great, continue current dose of atorvastatin .   A1C is 6.3 indicating borderline diabetes.  Please continue to work on reduced sugar/carb diet, and regular exercise.  PSA is normal.

## 2024-03-03 ENCOUNTER — Telehealth (HOSPITAL_BASED_OUTPATIENT_CLINIC_OR_DEPARTMENT_OTHER): Payer: Self-pay
